# Patient Record
Sex: Female | Born: 1980 | Race: Black or African American | Hispanic: No | Marital: Single | Smoking: Current some day smoker
Health system: Southern US, Community
[De-identification: ages and names within clinical notes are randomized; demographics above are authoritative.]

## PROBLEM LIST (undated history)

## (undated) DIAGNOSIS — D649 Anemia, unspecified: Secondary | ICD-10-CM

## (undated) DIAGNOSIS — E785 Hyperlipidemia, unspecified: Secondary | ICD-10-CM

## (undated) DIAGNOSIS — F329 Major depressive disorder, single episode, unspecified: Secondary | ICD-10-CM

## (undated) DIAGNOSIS — F411 Generalized anxiety disorder: Secondary | ICD-10-CM

## (undated) DIAGNOSIS — F32A Depression, unspecified: Secondary | ICD-10-CM

## (undated) DIAGNOSIS — Z8659 Personal history of other mental and behavioral disorders: Secondary | ICD-10-CM

## (undated) DIAGNOSIS — N840 Polyp of corpus uteri: Secondary | ICD-10-CM

## (undated) DIAGNOSIS — D259 Leiomyoma of uterus, unspecified: Secondary | ICD-10-CM

## (undated) DIAGNOSIS — I10 Essential (primary) hypertension: Secondary | ICD-10-CM

## (undated) HISTORY — DX: Essential (primary) hypertension: I10

## (undated) HISTORY — DX: Hyperlipidemia, unspecified: E78.5

---

## 2003-07-21 ENCOUNTER — Emergency Department (HOSPITAL_COMMUNITY): Admission: EM | Admit: 2003-07-21 | Discharge: 2003-07-21 | Payer: Self-pay | Admitting: Emergency Medicine

## 2003-07-23 ENCOUNTER — Emergency Department (HOSPITAL_COMMUNITY): Admission: EM | Admit: 2003-07-23 | Discharge: 2003-07-23 | Payer: Self-pay | Admitting: Emergency Medicine

## 2003-07-26 ENCOUNTER — Ambulatory Visit (HOSPITAL_COMMUNITY): Admission: RE | Admit: 2003-07-26 | Discharge: 2003-07-26 | Payer: Self-pay | Admitting: Internal Medicine

## 2003-07-26 ENCOUNTER — Encounter: Admission: RE | Admit: 2003-07-26 | Discharge: 2003-07-26 | Payer: Self-pay | Admitting: Internal Medicine

## 2003-08-30 ENCOUNTER — Emergency Department (HOSPITAL_COMMUNITY): Admission: EM | Admit: 2003-08-30 | Discharge: 2003-08-30 | Payer: Self-pay | Admitting: Emergency Medicine

## 2003-09-06 ENCOUNTER — Emergency Department (HOSPITAL_COMMUNITY): Admission: EM | Admit: 2003-09-06 | Discharge: 2003-09-07 | Payer: Self-pay | Admitting: Emergency Medicine

## 2003-11-10 ENCOUNTER — Emergency Department (HOSPITAL_COMMUNITY): Admission: EM | Admit: 2003-11-10 | Discharge: 2003-11-10 | Payer: Self-pay | Admitting: Emergency Medicine

## 2004-01-17 ENCOUNTER — Emergency Department (HOSPITAL_COMMUNITY): Admission: EM | Admit: 2004-01-17 | Discharge: 2004-01-17 | Payer: Self-pay | Admitting: Emergency Medicine

## 2006-03-09 ENCOUNTER — Emergency Department (HOSPITAL_COMMUNITY): Admission: EM | Admit: 2006-03-09 | Discharge: 2006-03-09 | Payer: Self-pay | Admitting: Emergency Medicine

## 2006-04-04 ENCOUNTER — Inpatient Hospital Stay (HOSPITAL_COMMUNITY): Admission: AD | Admit: 2006-04-04 | Discharge: 2006-04-04 | Payer: Self-pay | Admitting: Gynecology

## 2006-05-21 ENCOUNTER — Emergency Department (HOSPITAL_COMMUNITY): Admission: EM | Admit: 2006-05-21 | Discharge: 2006-05-21 | Payer: Self-pay | Admitting: Emergency Medicine

## 2006-10-13 ENCOUNTER — Emergency Department (HOSPITAL_COMMUNITY): Admission: EM | Admit: 2006-10-13 | Discharge: 2006-10-14 | Payer: Self-pay | Admitting: *Deleted

## 2007-07-12 ENCOUNTER — Emergency Department (HOSPITAL_COMMUNITY): Admission: EM | Admit: 2007-07-12 | Discharge: 2007-07-12 | Payer: Self-pay | Admitting: Emergency Medicine

## 2007-07-17 ENCOUNTER — Inpatient Hospital Stay (HOSPITAL_COMMUNITY): Admission: AD | Admit: 2007-07-17 | Discharge: 2007-07-17 | Payer: Self-pay | Admitting: Obstetrics and Gynecology

## 2008-08-07 ENCOUNTER — Inpatient Hospital Stay (HOSPITAL_COMMUNITY): Admission: AD | Admit: 2008-08-07 | Discharge: 2008-08-07 | Payer: Self-pay | Admitting: Obstetrics & Gynecology

## 2009-06-06 ENCOUNTER — Emergency Department (HOSPITAL_COMMUNITY): Admission: EM | Admit: 2009-06-06 | Discharge: 2009-06-06 | Payer: Self-pay | Admitting: Emergency Medicine

## 2009-06-28 ENCOUNTER — Ambulatory Visit: Payer: Self-pay | Admitting: Diagnostic Radiology

## 2009-06-28 ENCOUNTER — Emergency Department (HOSPITAL_BASED_OUTPATIENT_CLINIC_OR_DEPARTMENT_OTHER): Admission: EM | Admit: 2009-06-28 | Discharge: 2009-06-28 | Payer: Self-pay | Admitting: Emergency Medicine

## 2009-10-10 ENCOUNTER — Emergency Department (HOSPITAL_COMMUNITY): Admission: EM | Admit: 2009-10-10 | Discharge: 2009-10-10 | Payer: Self-pay | Admitting: Emergency Medicine

## 2010-02-23 ENCOUNTER — Inpatient Hospital Stay (HOSPITAL_COMMUNITY): Admission: AD | Admit: 2010-02-23 | Discharge: 2010-02-23 | Payer: Self-pay | Admitting: Obstetrics and Gynecology

## 2010-05-28 ENCOUNTER — Inpatient Hospital Stay (HOSPITAL_COMMUNITY): Admission: AD | Admit: 2010-05-28 | Discharge: 2010-05-28 | Payer: Self-pay | Admitting: Obstetrics

## 2010-08-06 ENCOUNTER — Encounter: Payer: Self-pay | Admitting: Obstetrics & Gynecology

## 2010-09-26 LAB — POCT PREGNANCY, URINE: Preg Test, Ur: NEGATIVE

## 2010-09-29 LAB — URINE MICROSCOPIC-ADD ON

## 2010-09-29 LAB — COMPREHENSIVE METABOLIC PANEL
AST: 15 U/L (ref 0–37)
Albumin: 3.1 g/dL — ABNORMAL LOW (ref 3.5–5.2)
Calcium: 8.1 mg/dL — ABNORMAL LOW (ref 8.4–10.5)
Chloride: 109 mEq/L (ref 96–112)
Creatinine, Ser: 0.91 mg/dL (ref 0.4–1.2)
GFR calc Af Amer: >60 mL/min (ref >60)
Total Bilirubin: 0.3 mg/dL (ref 0.3–1.2)
Total Protein: 6.2 g/dL (ref 6.0–8.3)

## 2010-09-29 LAB — URINALYSIS, ROUTINE W REFLEX MICROSCOPIC
Bilirubin Urine: NEGATIVE
Glucose, UA: NEGATIVE mg/dL
Ketones, ur: NEGATIVE mg/dL
Protein, ur: NEGATIVE mg/dL
Urobilinogen, UA: 0.2 mg/dL (ref 0.0–1.0)

## 2010-09-29 LAB — WET PREP, GENITAL
Clue Cells Wet Prep HPF POC: NONE SEEN
Trich, Wet Prep: NONE SEEN
Yeast Wet Prep HPF POC: NONE SEEN

## 2010-09-29 LAB — CBC
MCH: 24.3 pg — ABNORMAL LOW (ref 26.0–34.0)
MCHC: 32.5 g/dL (ref 30.0–36.0)
Platelets: 450 10*3/uL — ABNORMAL HIGH (ref 150–400)
RDW: 17.7 % — ABNORMAL HIGH (ref 11.5–15.5)

## 2010-09-29 LAB — GC/CHLAMYDIA PROBE AMP, GENITAL: GC Probe Amp, Genital: NEGATIVE

## 2010-09-29 LAB — POCT PREGNANCY, URINE: Preg Test, Ur: NEGATIVE

## 2010-10-30 LAB — URINALYSIS, ROUTINE W REFLEX MICROSCOPIC
Bilirubin Urine: NEGATIVE
Glucose, UA: NEGATIVE mg/dL
Protein, ur: 30 mg/dL — AB
Urobilinogen, UA: 0.2 mg/dL (ref 0.0–1.0)

## 2010-10-30 LAB — DIFFERENTIAL
Basophils Relative: 0 % (ref 0–1)
Eosinophils Absolute: 0 10*3/uL (ref 0.0–0.7)
Eosinophils Relative: 0 % (ref 0–5)
Lymphs Abs: 1 10*3/uL (ref 0.7–4.0)
Monocytes Absolute: 0.2 10*3/uL (ref 0.1–1.0)
Monocytes Relative: 2 % — ABNORMAL LOW (ref 3–12)

## 2010-10-30 LAB — URINE MICROSCOPIC-ADD ON

## 2010-10-30 LAB — WET PREP, GENITAL
Clue Cells Wet Prep HPF POC: NONE SEEN
Yeast Wet Prep HPF POC: NONE SEEN

## 2010-10-30 LAB — GC/CHLAMYDIA PROBE AMP, GENITAL
Chlamydia, DNA Probe: NEGATIVE
GC Probe Amp, Genital: NEGATIVE

## 2010-10-30 LAB — CBC
HCT: 31 % — ABNORMAL LOW (ref 36.0–46.0)
Hemoglobin: 10.1 g/dL — ABNORMAL LOW (ref 12.0–15.0)
MCHC: 32.6 g/dL (ref 30.0–36.0)
MCV: 77 fL — ABNORMAL LOW (ref 78.0–100.0)
RBC: 4.03 MIL/uL (ref 3.87–5.11)

## 2011-03-12 ENCOUNTER — Encounter (HOSPITAL_COMMUNITY): Payer: Self-pay | Admitting: *Deleted

## 2011-03-12 ENCOUNTER — Inpatient Hospital Stay (HOSPITAL_COMMUNITY)
Admission: AD | Admit: 2011-03-12 | Discharge: 2011-03-12 | Disposition: A | Discharge status: Home / Self Care | Payer: 59 | Source: Ambulatory Visit | Attending: Obstetrics & Gynecology | Admitting: Obstetrics & Gynecology

## 2011-03-12 DIAGNOSIS — R109 Unspecified abdominal pain: Secondary | ICD-10-CM | POA: Insufficient documentation

## 2011-03-12 NOTE — Progress Notes (Signed)
PT WANTS TO LEAVE AMA STATES SHE CANNOT WAIT TO BE SEEN.

## 2011-03-12 NOTE — Progress Notes (Signed)
ON CYCLE NOW.  WAS DX IN 2006 IN [facility].   SAW DR Tamela Oddi  -  THINKS IN DEC - OR JAN- DISCUSSING AN INJECTION- DID NOT GET IT.   SAYS HURT BAD  ON 03-09-2011-  TOOK ADVIL AND TREADOL -  HARD TO FOCUS AT WORK- NOT MUCH RELIEF  FROM ADVIL.  PAD ON IN TRIAGE- LIGHT RED - SMALL SMEAR.

## 2011-04-05 LAB — WET PREP, GENITAL
Trich, Wet Prep: NONE SEEN
Yeast Wet Prep HPF POC: NONE SEEN

## 2011-04-05 LAB — GC/CHLAMYDIA PROBE AMP, GENITAL
Chlamydia, DNA Probe: NEGATIVE
GC Probe Amp, Genital: NEGATIVE

## 2011-04-05 LAB — CBC
Hemoglobin: 10.5 — ABNORMAL LOW
MCHC: 33.5
RDW: 16.1 — ABNORMAL HIGH

## 2011-06-19 ENCOUNTER — Encounter (HOSPITAL_COMMUNITY): Payer: Self-pay | Admitting: *Deleted

## 2011-06-19 ENCOUNTER — Inpatient Hospital Stay (HOSPITAL_COMMUNITY)
Admission: AD | Admit: 2011-06-19 | Discharge: 2011-06-19 | Disposition: A | Discharge status: Home / Self Care | Payer: 59 | Source: Ambulatory Visit | Attending: Obstetrics & Gynecology | Admitting: Obstetrics & Gynecology

## 2011-06-19 ENCOUNTER — Inpatient Hospital Stay (HOSPITAL_COMMUNITY): Payer: 59

## 2011-06-19 DIAGNOSIS — R109 Unspecified abdominal pain: Secondary | ICD-10-CM | POA: Insufficient documentation

## 2011-06-19 DIAGNOSIS — R51 Headache: Secondary | ICD-10-CM | POA: Insufficient documentation

## 2011-06-19 DIAGNOSIS — R42 Dizziness and giddiness: Secondary | ICD-10-CM | POA: Insufficient documentation

## 2011-06-19 LAB — CBC
HCT: 31.1 % — ABNORMAL LOW (ref 36.0–46.0)
Hemoglobin: 9.7 g/dL — ABNORMAL LOW (ref 12.0–15.0)
MCH: 21.7 pg — ABNORMAL LOW (ref 26.0–34.0)
MCV: 69.7 fL — ABNORMAL LOW (ref 78.0–100.0)
Platelets: 503 10*3/uL — ABNORMAL HIGH (ref 150–400)
RBC: 4.46 MIL/uL (ref 3.87–5.11)
WBC: 7.5 10*3/uL (ref 4.0–10.5)

## 2011-06-19 LAB — URINALYSIS, ROUTINE W REFLEX MICROSCOPIC
Hgb urine dipstick: NEGATIVE
Leukocytes, UA: NEGATIVE
Nitrite: NEGATIVE
Protein, ur: NEGATIVE mg/dL
Specific Gravity, Urine: 1.01 (ref 1.005–1.030)
Urobilinogen, UA: 0.2 mg/dL (ref 0.0–1.0)

## 2011-06-19 LAB — WET PREP, GENITAL: Yeast Wet Prep HPF POC: NONE SEEN

## 2011-06-19 LAB — POCT PREGNANCY, URINE: Preg Test, Ur: NEGATIVE

## 2011-06-19 MED ORDER — KETOROLAC TROMETHAMINE 60 MG/2ML IM SOLN
60.0000 mg | Freq: Once | INTRAMUSCULAR | Status: AC
  Administered 2011-06-19: 60 mg via INTRAMUSCULAR
  Filled 2011-06-19: qty 2

## 2011-06-19 NOTE — Progress Notes (Signed)
Has been on nuva ring since September 29th, due to endometriosis has been on it constantly since then.  Started having breakthrough bleeding 3 weeks ago, was told by gyn to remove nuva ring to have a period.  Removed nuva ring last night, woke up this morning with severe abdominal cramping, headaches, and dizziness.

## 2011-07-05 ENCOUNTER — Emergency Department (HOSPITAL_COMMUNITY)
Admission: EM | Admit: 2011-07-05 | Discharge: 2011-07-05 | Disposition: A | Discharge status: Home / Self Care | Payer: Managed Care, Other (non HMO) | Attending: Emergency Medicine | Admitting: Emergency Medicine

## 2011-07-05 ENCOUNTER — Other Ambulatory Visit: Payer: Self-pay

## 2011-07-05 ENCOUNTER — Encounter (HOSPITAL_COMMUNITY): Payer: Self-pay

## 2011-07-05 DIAGNOSIS — R079 Chest pain, unspecified: Secondary | ICD-10-CM | POA: Insufficient documentation

## 2011-07-05 DIAGNOSIS — F411 Generalized anxiety disorder: Secondary | ICD-10-CM | POA: Insufficient documentation

## 2011-07-05 DIAGNOSIS — I498 Other specified cardiac arrhythmias: Secondary | ICD-10-CM | POA: Insufficient documentation

## 2011-07-05 DIAGNOSIS — F419 Anxiety disorder, unspecified: Secondary | ICD-10-CM

## 2011-07-05 LAB — POCT I-STAT, CHEM 8
BUN: 8 mg/dL (ref 6–23)
Calcium, Ion: 1.16 mmol/L (ref 1.12–1.32)
Chloride: 108 mEq/L (ref 96–112)
Glucose, Bld: 82 mg/dL (ref 70–99)
HCT: 35 % — ABNORMAL LOW (ref 36.0–46.0)
Potassium: 3.8 mEq/L (ref 3.5–5.1)

## 2011-07-05 MED ORDER — ALPRAZOLAM 0.5 MG PO TABS: 0.5000 mg | ORAL_TABLET | Freq: Every evening | ORAL | Status: AC | PRN

## 2011-07-05 NOTE — ED Provider Notes (Signed)
History     CSN: [id number]  Arrival date & time 07/05/11  4098   First MD Initiated Contact with Patient 07/05/11 1040      Chief Complaint  Patient presents with  . Panic Attack  . Chest Pain    (Consider location/radiation/quality/duration/timing/severity/associated sxs/prior treatment) HPI Pt states woke up this am sweating and having chest pain, states took a shower and went back to bed but still feels the same; pt states under a lot or increased stress, hx of depression and "mental health" problems without medical tx.  patient has been under severe stress at work.  Patient denies homicidal or suicidal thoughts or ideations.  Patient has no previous history of coronary or pulmonary problems.  Past Medical History  Diagnosis Date  . Endometriosis     Past Surgical History  Procedure Date  . No past surgeries     Family History  Problem Relation Age of Onset  . Diabetes Maternal Grandfather   . Hypertension Maternal Grandfather     History  Substance Use Topics  . Smoking status: Former Smoker    Types: Cigars  . Smokeless tobacco: Not on file  . Alcohol Use: No     socially    OB History    Grav Para Term Preterm Abortions TAB SAB Ect Mult Living   1 1 1       1       Review of Systems  Allergies  Review of patient's allergies indicates no known allergies. Remaining review of systems unremarkable except as noted in history of present illness Home Medications   Current Outpatient Rx  Name Route Sig Dispense Refill  . ETONOGESTREL-ETHINYL ESTRADIOL 0.12-0.015 MG/24HR VA RING Vaginal Place 1 each vaginally every 28 (twenty-eight) days. Insert vaginally and leave in place for 3 consecutive weeks, then remove for 1 week.     . TRAMADOL HCL 50 MG PO TABS Oral Take 50 mg by mouth every 6 (six) hours as needed. Maximum dose= 8 tablets per day takes for pain     . ALPRAZOLAM 0.5 MG PO TABS Oral Take 1 tablet (0.5 mg total) by mouth at bedtime as needed for  sleep. 30 tablet 0    BP 132/89  Pulse 60  Temp(Src) 98.6 F (37 C) (Oral)  Resp 18  SpO2 100%  LMP 05/29/2011  Physical Exam  Nursing note and vitals reviewed. Constitutional: She is oriented to person, place, and time. She appears well-developed and well-nourished. No distress.  HENT:  Head: Normocephalic and atraumatic.  Eyes: Pupils are equal, round, and reactive to light.  Neck: Normal range of motion.  Cardiovascular: Normal rate and intact distal pulses.  Exam reveals no friction rub.   No murmur heard.        Date: 07/05/2011  Rate: 49  Rhythm: sinus bradycardia  QRS Axis: normal  Intervals: normal  ST/T Wave abnormalities: normal  Conduction Disutrbances:none:   Old EKG Reviewed: unchanged     Pulmonary/Chest: No respiratory distress.  Abdominal: Normal appearance. She exhibits no distension.  Musculoskeletal: Normal range of motion.  Neurological: She is alert and oriented to person, place, and time. No cranial nerve deficit.  Skin: Skin is warm and dry. No rash noted.  Psychiatric: She has a normal mood and affect. Her behavior is normal.    ED Course  Procedures (including critical care time)  Labs Reviewed  POCT I-STAT, CHEM 8 - Abnormal; Notable for the following:    Hemoglobin 11.9 (*)  HCT 35.0 (*)    All other components within normal limits  POCT I-STAT TROPONIN I  I-STAT TROPONIN I  I-STAT, CHEM 8   No results found.   1. Anxiety       MDM         Nelia Shi, MD 07/05/11 [phone]

## 2011-07-05 NOTE — ED Notes (Signed)
Pt states woke up this am sweating and having chest pain, states took a shower and went back to bed but still feels the same; pt states under a lot or increased stress, hx of depression and "mental health" problems without medical tx

## 2011-12-15 ENCOUNTER — Inpatient Hospital Stay (HOSPITAL_COMMUNITY): Payer: 59

## 2011-12-15 ENCOUNTER — Inpatient Hospital Stay (HOSPITAL_COMMUNITY)
Admission: AD | Admit: 2011-12-15 | Discharge: 2011-12-15 | Disposition: A | Discharge status: Home / Self Care | Payer: 59 | Source: Ambulatory Visit | Attending: Obstetrics | Admitting: Obstetrics

## 2011-12-15 DIAGNOSIS — D259 Leiomyoma of uterus, unspecified: Secondary | ICD-10-CM | POA: Insufficient documentation

## 2011-12-15 DIAGNOSIS — N949 Unspecified condition associated with female genital organs and menstrual cycle: Secondary | ICD-10-CM | POA: Insufficient documentation

## 2011-12-15 DIAGNOSIS — N939 Abnormal uterine and vaginal bleeding, unspecified: Secondary | ICD-10-CM

## 2011-12-15 DIAGNOSIS — N898 Other specified noninflammatory disorders of vagina: Secondary | ICD-10-CM

## 2011-12-15 DIAGNOSIS — R109 Unspecified abdominal pain: Secondary | ICD-10-CM | POA: Insufficient documentation

## 2011-12-15 DIAGNOSIS — N938 Other specified abnormal uterine and vaginal bleeding: Secondary | ICD-10-CM | POA: Insufficient documentation

## 2011-12-15 LAB — DIFFERENTIAL
Basophils Absolute: 0 10*3/uL (ref 0.0–0.1)
Basophils Relative: 0 % (ref 0–1)
Eosinophils Absolute: 0 10*3/uL (ref 0.0–0.7)
Eosinophils Relative: 1 % (ref 0–5)
Neutrophils Relative %: 67 % (ref 43–77)

## 2011-12-15 LAB — URINALYSIS, ROUTINE W REFLEX MICROSCOPIC
Glucose, UA: NEGATIVE mg/dL
Leukocytes, UA: NEGATIVE
Protein, ur: NEGATIVE mg/dL
Specific Gravity, Urine: 1.025 (ref 1.005–1.030)
Urobilinogen, UA: 0.2 mg/dL (ref 0.0–1.0)

## 2011-12-15 LAB — CBC
MCH: 25.3 pg — ABNORMAL LOW (ref 26.0–34.0)
MCV: 77 fL — ABNORMAL LOW (ref 78.0–100.0)
Platelets: 351 10*3/uL (ref 150–400)
RBC: 4.7 MIL/uL (ref 3.87–5.11)
RDW: 17 % — ABNORMAL HIGH (ref 11.5–15.5)

## 2011-12-15 LAB — POCT PREGNANCY, URINE: Preg Test, Ur: NEGATIVE

## 2011-12-15 LAB — URINE MICROSCOPIC-ADD ON

## 2011-12-15 MED ORDER — KETOROLAC TROMETHAMINE 60 MG/2ML IM SOLN
60.0000 mg | Freq: Once | INTRAMUSCULAR | Status: AC
  Administered 2011-12-15: 60 mg via INTRAMUSCULAR
  Filled 2011-12-15: qty 2

## 2011-12-15 MED ORDER — IBUPROFEN 800 MG PO TABS: 800.0000 mg | ORAL_TABLET | Freq: Three times a day (TID) | ORAL | Status: AC | PRN

## 2011-12-15 NOTE — MAU Note (Signed)
Patient is on Nuva Ring for endometriosis, woke up this morning with bleeding and clots, last changed on 11/29/11, cramping

## 2011-12-15 NOTE — Progress Notes (Signed)
[facility]  EXam BY E. Key NP vaginal bleeding noted

## 2011-12-15 NOTE — MAU Note (Signed)
Bleeding very minimal here. Pt states bleeding nor pain worsened with TV US. States pain "slightly improved".

## 2011-12-15 NOTE — MAU Provider Note (Signed)
History     CSN: [id number]  Arrival date and time: 12/15/11 1208   First Provider Initiated Contact with Patient 12/15/11 1359      Chief Complaint  Patient presents with  . Vaginal Bleeding  . Abdominal Pain   HPI Danielle Dawson is 31 y.o. G1P1001 presents with heavy vaginal bleeding. Passed a blood clot at 2pm that was "stuck" she pulled it and it was "thick".  Passes another clot.   LMP 10/25/11.  Patient of Dr. Marcia Brash.  Hx of endometeriosis treated with continuous [place] of Nuvaring. She reports she was instructed to keep each ring in for 35days but had period around the 35th day so she on her own is now changing her ring every 30 days.  This is "better".   She uses 3 rings in a row and then out until menses begin and restarts at the end of a cycle.  Pain today is different as the pain she gets with endometriosis, less now but pulsating on the left.  Not sexually active since January.  She is not concerned about STi/s stating she has been tested for these.  Denies nausea, vomiting , fever or chills.    Past Medical History  Diagnosis Date  . Endometriosis     Past Surgical History  Procedure Date  . No past surgeries     Family History  Problem Relation Age of Onset  . Diabetes Maternal Grandfather   . Hypertension Maternal Grandfather     History  Substance Use Topics  . Smoking status: Former Smoker    Types: Cigars  . Smokeless tobacco: Not on file  . Alcohol Use: No     socially    Allergies: No Known Allergies  Prescriptions prior to admission  Medication Sig Dispense Refill  . etonogestrel-ethinyl estradiol (NUVARING) 0.12-0.015 MG/24HR vaginal ring Place 1 each vaginally every 28 (twenty-eight) days. Insert vaginally and leave in place for 3 consecutive weeks, then remove for 1 week.         Review of Systems  Constitutional: Negative for fever and chills.  Gastrointestinal: Positive for abdominal pain (left sided-lower). Negative for nausea and  vomiting.  Genitourinary:       + for vaginal bleeding with passing of 2 large clots   Physical Exam   Blood pressure 128/92, pulse 70, temperature 98.8 F (37.1 C), height 5\' 5"  (1.651 m), weight 81.466 kg (179 lb 9.6 oz), last menstrual period 10/25/2011.  Physical Exam  Constitutional: She is oriented to person, place, and time. She appears well-developed and well-nourished. No distress.  HENT:  Head: Normocephalic.  Neck: Normal range of motion.  Cardiovascular: Normal rate.   Respiratory: Effort normal.  GI: Soft. There is tenderness (on the left). There is no rebound and no guarding.  Genitourinary: Uterus is not enlarged and not tender. Cervix exhibits no motion tenderness, no discharge and no friability. Right adnexum displays no mass, no tenderness and no fullness. Left adnexum displays tenderness. Left adnexum displays no mass and no fullness. There is bleeding (small amount of bleeding with a few pin size clots) around the vagina.  Neurological: She is alert and oriented to person, place, and time.  Skin: Skin is warm and dry.  Psychiatric: She has a normal mood and affect. Her behavior is normal. Thought content normal.   Clinical Data: Vaginal bleeding, left-sided pain.  TRANSVAGINAL ULTRASOUND OF PELVIS  Technique: Transvaginal ultrasound examination of the pelvis was  performed including evaluation of the uterus, ovaries, adnexal  regions, and pelvic cul-de-sac.  Comparison: 06/19/2011  Findings:  Uterus: 8.8 x 5.4 x 6.8 cm. The uterus is retroverted. 1.9 cm  and 1.5 cm fibroids anteriorly.  Endometrium: 3 mm in thickness, slightly thicker in the  endocervical canal at 5 mm, possible small amount of blood within  the endocervical canal.  Right ovary: 3.5 x 1.1 x 1.9 cm. Normal size and echotexture. No  adnexal masses.  Left ovary: 3.3 x 1.6 x 1.9 cm. Normal size and echotexture. No  adnexal masses.  Other Findings: Trace free fluid in the pelvis.  IMPRESSION:    Small anterior fibroid as above. Otherwise unremarkable study.  Original Report Authenticated By: Cyndie Chime, M.D.   Results for orders placed during the hospital encounter of 12/15/11 (from the past 24 hour(s))  URINALYSIS, ROUTINE W REFLEX MICROSCOPIC     Status: Abnormal   Collection Time   12/15/11 12:15 PM      Component Value Range   Color, Urine YELLOW  YELLOW    APPearance CLEAR  CLEAR    Specific Gravity, Urine 1.025  1.005 - 1.030    pH 5.5  5.0 - 8.0    Glucose, UA NEGATIVE  NEGATIVE (mg/dL)   Hgb urine dipstick LARGE (*) NEGATIVE    Bilirubin Urine NEGATIVE  NEGATIVE    Ketones, ur NEGATIVE  NEGATIVE (mg/dL)   Protein, ur NEGATIVE  NEGATIVE (mg/dL)   Urobilinogen, UA 0.2  0.0 - 1.0 (mg/dL)   Nitrite NEGATIVE  NEGATIVE    Leukocytes, UA NEGATIVE  NEGATIVE   URINE MICROSCOPIC-ADD ON     Status: Normal   Collection Time   12/15/11 12:15 PM      Component Value Range   Squamous Epithelial / LPF RARE  RARE    RBC / HPF TOO NUMEROUS TO COUNT  <3 (RBC/hpf)  POCT PREGNANCY, URINE     Status: Normal   Collection Time   12/15/11 12:29 PM      Component Value Range   Preg Test, Ur NEGATIVE  NEGATIVE   CBC     Status: Abnormal   Collection Time   12/15/11  2:21 PM      Component Value Range   WBC 8.6  4.0 - 10.5 (K/uL)   RBC 4.70  3.87 - 5.11 (MIL/uL)   Hemoglobin 11.9 (*) 12.0 - 15.0 (g/dL)   HCT 16.1  09.6 - 04.5 (%)   MCV 77.0 (*) 78.0 - 100.0 (fL)   MCH 25.3 (*) 26.0 - 34.0 (pg)   MCHC 32.9  30.0 - 36.0 (g/dL)   RDW 40.9 (*) 81.1 - 15.5 (%)   Platelets 351  150 - 400 (K/uL)  DIFFERENTIAL     Status: Normal   Collection Time   12/15/11  2:21 PM      Component Value Range   Neutrophils Relative 67  43 - 77 (%)   Neutro Abs 5.7  1.7 - 7.7 (K/uL)   Lymphocytes Relative 26  12 - 46 (%)   Lymphs Abs 2.2  0.7 - 4.0 (K/uL)   Monocytes Relative 6  3 - 12 (%)   Monocytes Absolute 0.5  0.1 - 1.0 (K/uL)   Eosinophils Relative 1  0 - 5 (%)   Eosinophils Absolute 0.0  0.0 -  0.7 (K/uL)   Basophils Relative 0  0 - 1 (%)   Basophils Absolute 0.0  0.0 - 0.1 (K/uL)   MAU Course  Procedures  GC/CHL culture sent to lab  MDM  Patient brought in a large clot that appeared to mostly be tissue.  Sent to pathology for evaluation Back from ultrasound.  Pain continues.  Will give Toradol 60mg  IM now  Assessment and Plan  A: Abnormal Vaginal bleeding     Uterine fibroid on ultrasound     Abdominal pain with hx of endometriosis  P:  Toradol given here      Rx for ibuprofen 600mg  po q8 hrs prn #20     Follow up with Dr. Tamela Oddi     Call their office for pathology results in 1-2 weeks  Natiya Seelinger,EVE M 12/15/2011, 1:59 PM

## 2011-12-15 NOTE — MAU Note (Signed)
Pt states she started having heavy bleeding and clotting this morning about 10am, that has continued

## 2011-12-15 NOTE — Discharge Instructions (Signed)
Fibroids Fibroids are lumps (tumors) that can occur any place in a woman's body. These lumps are not cancerous. Fibroids vary in size, weight, and where they grow. HOME CARE  Do not take aspirin.   Write down the number of pads or tampons you use during your period. Tell your doctor. This can help determine the best treatment for you.  GET HELP RIGHT AWAY IF:  You have pain in your lower belly (abdomen) that is not helped with medicine.   You have cramps that are not helped with medicine.   You have more bleeding between or during your period.   You feel lightheaded or pass out (faint).   Your lower belly pain gets worse.  MAKE SURE YOU:  Understand these instructions.   Will watch your condition.   Will get help right away if you are not doing well or get worse.  Document Released: 08/04/2010 Document Revised: 06/21/2011 Document Reviewed: 08/04/2010 [employer] Patient Information 2012 [employer].Abnormal Vaginal Bleeding Abnormal vaginal bleeding means bleeding from the vagina that is not your normal menstrual period. Bleeding may be heavy or light. It may last for days or come and go. There are many problems that may cause this. HOME CARE  Keep track of your periods on a calendar if they are not regular.   Write down:   How often pads or tampons are changed.   The size and number of clots, if there are any.   A change in the color of the blood.   A change in the amount of blood.   Any smell.   The time and strength of cramps or pain.   Limit activity as told.   Eat a healthy diet.   Do not have sex (intercourse) until your doctor says it is okay.   Never have unprotected sex unless you are trying to get pregnant.   Only take medicine as told by your doctor.  GET HELP RIGHT AWAY IF:   You get dizzy or feel faint when standing up.   You have to change pads or tampons more than once an hour.   You feel a sudden change in your pain.   You start  bleeding heavily.   You develop a fever.  MAKE SURE YOU:  Understand these instructions.   Will watch your condition.   Will get help right away if you are not doing well or get worse.  Document Released: 04/29/2009 Document Revised: 06/21/2011 Document Reviewed: 04/29/2009 [employer] Patient Information 2012 [employer]. 

## 2012-08-31 ENCOUNTER — Inpatient Hospital Stay (HOSPITAL_COMMUNITY)
Admission: AD | Admit: 2012-08-31 | Discharge: 2012-09-01 | Disposition: A | Discharge status: Hospice | Payer: Medicaid Other | Source: Ambulatory Visit | Attending: Obstetrics & Gynecology | Admitting: Obstetrics & Gynecology

## 2012-08-31 DIAGNOSIS — N809 Endometriosis, unspecified: Secondary | ICD-10-CM | POA: Insufficient documentation

## 2012-08-31 DIAGNOSIS — N949 Unspecified condition associated with female genital organs and menstrual cycle: Secondary | ICD-10-CM | POA: Insufficient documentation

## 2012-08-31 DIAGNOSIS — N946 Dysmenorrhea, unspecified: Secondary | ICD-10-CM | POA: Insufficient documentation

## 2012-08-31 NOTE — MAU Note (Signed)
Pt stes she has a history of endometrosis-states she is on her period and it is more painful than it usually is-took m ortrin 800mg  t 1700-tramadol 1 tab at 2000- no relief

## 2012-09-01 ENCOUNTER — Inpatient Hospital Stay (HOSPITAL_COMMUNITY): Payer: Medicaid Other

## 2012-09-01 ENCOUNTER — Encounter (HOSPITAL_COMMUNITY): Payer: Self-pay | Admitting: *Deleted

## 2012-09-01 DIAGNOSIS — N809 Endometriosis, unspecified: Secondary | ICD-10-CM

## 2012-09-01 LAB — CBC WITH DIFFERENTIAL/PLATELET
Eosinophils Absolute: 0.1 10*3/uL (ref 0.0–0.7)
Eosinophils Relative: 1 % (ref 0–5)
HCT: 32.7 % — ABNORMAL LOW (ref 36.0–46.0)
Hemoglobin: 10.4 g/dL — ABNORMAL LOW (ref 12.0–15.0)
Lymphs Abs: 2.4 10*3/uL (ref 0.7–4.0)
MCH: 24.8 pg — ABNORMAL LOW (ref 26.0–34.0)
MCHC: 31.8 g/dL (ref 30.0–36.0)
MCV: 77.9 fL — ABNORMAL LOW (ref 78.0–100.0)
Monocytes Absolute: 0.8 10*3/uL (ref 0.1–1.0)
Monocytes Relative: 10 % (ref 3–12)
Neutrophils Relative %: 61 % (ref 43–77)
RBC: 4.2 MIL/uL (ref 3.87–5.11)

## 2012-09-01 LAB — URINE MICROSCOPIC-ADD ON

## 2012-09-01 LAB — URINALYSIS, ROUTINE W REFLEX MICROSCOPIC
Bilirubin Urine: NEGATIVE
Glucose, UA: NEGATIVE mg/dL
Protein, ur: NEGATIVE mg/dL

## 2012-09-01 LAB — GC/CHLAMYDIA PROBE AMP
CT Probe RNA: NEGATIVE
GC Probe RNA: NEGATIVE

## 2012-09-01 MED ORDER — KETOROLAC TROMETHAMINE 60 MG/2ML IM SOLN
60.0000 mg | Freq: Once | INTRAMUSCULAR | Status: AC
  Administered 2012-09-01: 60 mg via INTRAMUSCULAR
  Filled 2012-09-01: qty 2

## 2012-09-01 MED ORDER — TRAMADOL HCL 50 MG PO TABS: 50.0000 mg | ORAL_TABLET | Freq: Four times a day (QID) | ORAL | Status: DC | PRN

## 2012-09-01 NOTE — MAU Note (Signed)
Pt started cycle on Saturday the 15th and has mod -heavy flow changing her pad 8 times in 12 hrs.  Says pads are soaked when changing.

## 2012-09-01 NOTE — MAU Provider Note (Signed)
History     CSN: [id number]  Arrival date and time: 08/31/12 2311   First Provider Initiated Contact with Patient 09/01/12 0034      Chief Complaint  Patient presents with  . Abdominal Pain   HPI This is a 32 y.o. female who presents with c/o pelvic pain, unrelieved by Motrin and Tramadol. States has been diagnosed with endometriosis, but stopped Nuvaring treatment in July. Never went back to [facility] after that, so did not get Rx refilled. States is still a patient there. States takes ibuprofen 800mg  regularly with Tramadol. Denies fever. Pain started with period.  RN Note: Pt started cycle on Saturday the 15th and has mod -heavy flow changing her pad 8 times in 12 hrs. Says pads are soaked when changing.      OB History   Grav Para Term Preterm Abortions TAB SAB Ect Mult Living   1 1 1       1       Past Medical History  Diagnosis Date  . Endometriosis   . Fibroid   . Anxiety     Past Surgical History  Procedure Laterality Date  . No past surgeries      Family History  Problem Relation Age of Onset  . Diabetes Maternal Grandfather   . Hypertension Maternal Grandfather     History  Substance Use Topics  . Smoking status: Former Smoker    Types: Cigars    Quit date: 06/01/2012  . Smokeless tobacco: Not on file  . Alcohol Use: No     Comment: socially    Allergies: No Known Allergies  Prescriptions prior to admission  Medication Sig Dispense Refill  . etonogestrel-ethinyl estradiol (NUVARING) 0.12-0.015 MG/24HR vaginal ring Place 1 each vaginally every 28 (twenty-eight) days. Insert vaginally and leave in place for 3 consecutive weeks, then remove for 1 week.         Review of Systems  Constitutional: Negative for fever, chills and malaise/fatigue.  Gastrointestinal: Positive for abdominal pain. Negative for nausea, vomiting, diarrhea and constipation.  Genitourinary: Negative for dysuria.  Neurological: Negative for weakness and headaches.   Physical  Exam   Blood pressure 129/73, pulse 68, temperature 97.9 F (36.6 C), temperature source Oral, resp. rate 16, height 5\' 4"  (1.626 m), weight 202 lb (91.627 kg), last menstrual period 08/31/2012, SpO2 100.00%.  Physical Exam  Constitutional: She is oriented to person, place, and time. She appears well-developed and well-nourished. No distress.  Cardiovascular: Normal rate.   Respiratory: Effort normal.  GI: Soft. She exhibits no distension and no mass. There is tenderness (over uterus and LLQ). There is no rebound and no guarding.  Genitourinary: Uterus normal. Vaginal discharge (moderate vaginal blood) found.  + cervical motion tenderness   Musculoskeletal: Normal range of motion.  Neurological: She is alert and oriented to person, place, and time.  Skin: Skin is warm and dry.  Psychiatric: She has a normal mood and affect.    MAU Course  Procedures  MDM Results for orders placed during the hospital encounter of 08/31/12 (from the past 24 hour(s))  WET PREP, GENITAL     Status: Abnormal   Collection Time    09/01/12 12:42 AM      Result Value Range   Yeast Wet Prep HPF POC NONE SEEN  NONE SEEN   Trich, Wet Prep NONE SEEN  NONE SEEN   Clue Cells Wet Prep HPF POC FEW (*) NONE SEEN   WBC, Wet Prep HPF POC FEW (*) NONE  SEEN  CBC WITH DIFFERENTIAL     Status: Abnormal   Collection Time    09/01/12 12:54 AM      Result Value Range   WBC 8.6  4.0 - 10.5 K/uL   RBC 4.20  3.87 - 5.11 MIL/uL   Hemoglobin 10.4 (*) 12.0 - 15.0 g/dL   HCT 95.2 (*) 84.1 - 32.4 %   MCV 77.9 (*) 78.0 - 100.0 fL   MCH 24.8 (*) 26.0 - 34.0 pg   MCHC 31.8  30.0 - 36.0 g/dL   RDW 40.1  02.7 - 25.3 %   Platelets 412 (*) 150 - 400 K/uL   Neutrophils Relative 61  43 - 77 %   Neutro Abs 5.3  1.7 - 7.7 K/uL   Lymphocytes Relative 28  12 - 46 %   Lymphs Abs 2.4  0.7 - 4.0 K/uL   Monocytes Relative 10  3 - 12 %   Monocytes Absolute 0.8  0.1 - 1.0 K/uL   Eosinophils Relative 1  0 - 5 %   Eosinophils Absolute  0.1  0.0 - 0.7 K/uL   Basophils Relative 1  0 - 1 %   Basophils Absolute 0.1  0.0 - 0.1 K/uL  URINALYSIS, ROUTINE W REFLEX MICROSCOPIC     Status: Abnormal   Collection Time    09/01/12  1:13 AM      Result Value Range   Color, Urine YELLOW  YELLOW   APPearance CLEAR  CLEAR   Specific Gravity, Urine >1.030 (*) 1.005 - 1.030   pH 6.0  5.0 - 8.0   Glucose, UA NEGATIVE  NEGATIVE mg/dL   Hgb urine dipstick LARGE (*) NEGATIVE   Bilirubin Urine NEGATIVE  NEGATIVE   Ketones, ur NEGATIVE  NEGATIVE mg/dL   Protein, ur NEGATIVE  NEGATIVE mg/dL   Urobilinogen, UA 0.2  0.0 - 1.0 mg/dL   Nitrite NEGATIVE  NEGATIVE   Leukocytes, UA NEGATIVE  NEGATIVE  URINE MICROSCOPIC-ADD ON     Status: None   Collection Time    09/01/12  1:13 AM      Result Value Range   Squamous Epithelial / LPF RARE  RARE   WBC, UA 0-2  <3 WBC/hpf   RBC / HPF 0-2  <3 RBC/hpf  US Pelvis Complete  09/01/2012  *RADIOLOGY REPORT*  Clinical Data: Pelvic pain  TRANSABDOMINAL AND TRANSVAGINAL ULTRASOUND OF PELVIS Technique:  Both transabdominal and transvaginal ultrasound examinations of the pelvis were performed. Transabdominal technique was performed for global imaging of the pelvis including uterus, ovaries, adnexal regions, and pelvic cul-de-sac.  It was necessary to proceed with endovaginal exam following the transabdominal exam to visualize the endometrium and adnexa.  Comparison:  12/15/2011  Findings:  Uterus: Retroverted with a small fibroid noted anteriorly, intramural.  Uterus measures 9.8 x 5.0 x 5.4 cm.  Fibroid measures 1.6 cm.  Endometrium: Fluid/blood products mobile within the endometrium. Measures 14 mm in thickness.  Right ovary:  Normal appearance, measuring 3.8 x 1.8 x 1.9 cm.  Left ovary: Normal appearance, measuring 3.4 x 1.5 x 2.5 cm.  Other findings: Small amount of free fluid.  IMPRESSION: 1.6 cm intramural fibroid.  Normal sonographic appearance to the ovaries.  Small amount of blood/debris within the endometrial  canal.  Small amount of free fluid.   Original Report Authenticated By: Jearld Lesch, M.D.     Assessment and Plan  A:  Pelvic pain      Endometriosis      Dysmenorrhea  No evidence for appy or ovarian cyst  P:  Discussed findings with patient      Rx Tramadol #20      Advised to call Dr Tamela Oddi this morning to make plan   [facility] 09/01/2012, 12:50 AM

## 2012-09-23 ENCOUNTER — Encounter (HOSPITAL_COMMUNITY): Payer: Self-pay | Admitting: Emergency Medicine

## 2012-09-23 ENCOUNTER — Emergency Department (HOSPITAL_COMMUNITY): Payer: Medicaid Other

## 2012-09-23 ENCOUNTER — Emergency Department (HOSPITAL_COMMUNITY)
Admission: EM | Admit: 2012-09-23 | Discharge: 2012-09-23 | Disposition: A | Discharge status: Home / Self Care | Payer: Medicaid Other | Attending: Emergency Medicine | Admitting: Emergency Medicine

## 2012-09-23 DIAGNOSIS — Z8659 Personal history of other mental and behavioral disorders: Secondary | ICD-10-CM | POA: Insufficient documentation

## 2012-09-23 DIAGNOSIS — Z87891 Personal history of nicotine dependence: Secondary | ICD-10-CM | POA: Insufficient documentation

## 2012-09-23 DIAGNOSIS — Z8742 Personal history of other diseases of the female genital tract: Secondary | ICD-10-CM | POA: Insufficient documentation

## 2012-09-23 DIAGNOSIS — R209 Unspecified disturbances of skin sensation: Secondary | ICD-10-CM | POA: Insufficient documentation

## 2012-09-23 DIAGNOSIS — M79601 Pain in right arm: Secondary | ICD-10-CM

## 2012-09-23 DIAGNOSIS — M25579 Pain in unspecified ankle and joints of unspecified foot: Secondary | ICD-10-CM | POA: Insufficient documentation

## 2012-09-23 DIAGNOSIS — M7989 Other specified soft tissue disorders: Secondary | ICD-10-CM | POA: Insufficient documentation

## 2012-09-23 LAB — POCT I-STAT, CHEM 8
HCT: 34 % — ABNORMAL LOW (ref 36.0–46.0)
Hemoglobin: 11.6 g/dL — ABNORMAL LOW (ref 12.0–15.0)
Potassium: 3.8 mEq/L (ref 3.5–5.1)
Sodium: 140 mEq/L (ref 135–145)

## 2012-09-23 MED ORDER — NAPROXEN 500 MG PO TABS: 500.0000 mg | ORAL_TABLET | Freq: Two times a day (BID) | ORAL | Status: DC

## 2012-09-23 NOTE — Progress Notes (Signed)
Confirmed pcp as Jeanni Saari EPIC updated

## 2012-09-23 NOTE — ED Notes (Signed)
States that she woke up this am and her feet and hands were swollen. States that she has some tingling in her toes and feet as well. States that she has never had this pain before.

## 2012-09-23 NOTE — ED Provider Notes (Signed)
History  This chart was scribed for non-physician practitioner working with Hilario Quarry, MD, by Candelaria Stagers, ED Scribe. This patient was seen in room WTR6/WTR6 and the patient's care was started at 3:49 PM   CSN: [id number]  Arrival date & time 09/23/12  1443   First MD Initiated Contact with Patient 09/23/12 1532      Chief Complaint  Patient presents with  . Hand Pain  . Foot Pain     The history is provided by the patient. No language interpreter was used.   Danielle Dawson is a 32 y.o. female who presents to the Emergency Department complaining of bilateral hand and bilateral foot numbness and tingling that started earlier today.  She is also experiencing swelling and tightness stating that she was not able to put her shoes on this morning.  Pt states that the numbness started with her right foot and then she began experiencing tingling to the hands, then the feet.  Pt reports that earlier today her hands and feet were red.  She denies coldness to the hands or feet.  Pt is urinating normally.  She denies SOB or chest pain.  She denies h/o diabetes.  Pt reports she has never experienced these sx before.        Past Medical History  Diagnosis Date  . Endometriosis   . Fibroid   . Anxiety     Past Surgical History  Procedure Laterality Date  . No past surgeries      Family History  Problem Relation Age of Onset  . Diabetes Maternal Grandfather   . Hypertension Maternal Grandfather     History  Substance Use Topics  . Smoking status: Former Smoker    Types: Cigars    Quit date: 06/01/2012  . Smokeless tobacco: Not on file  . Alcohol Use: No     Comment: socially    OB History   Grav Para Term Preterm Abortions TAB SAB Ect Mult Living   1 1 1       1       Review of Systems  Respiratory: Negative for shortness of breath.   Cardiovascular: Negative for chest pain.  Genitourinary: Negative for difficulty urinating.  Skin: Positive for color change (redness  to hands).  Neurological: Positive for numbness (numbness and tingling to bilateral hands and feet).  All other systems reviewed and are negative.    Allergies  Review of patient's allergies indicates no known allergies.  Home Medications   Current Outpatient Rx  Name  Route  Sig  Dispense  Refill  . Multiple Vitamin (ONE-A-DAY ESSENTIAL PO)   Oral   Take 1 tablet by mouth daily.         . traMADol (ULTRAM) 50 MG tablet   Oral   Take 1 tablet (50 mg total) by mouth every 6 (six) hours as needed for pain.   20 tablet   0   . naproxen (NAPROSYN) 500 MG tablet   Oral   Take 1 tablet (500 mg total) by mouth 2 (two) times daily.   30 tablet   0     BP 121/79  Pulse 63  Temp(Src) 98.4 F (36.9 C) (Oral)  Resp 18  SpO2 100%  LMP 08/31/2012  Physical Exam  Nursing note and vitals reviewed. Constitutional: She is oriented to person, place, and time. She appears well-developed and well-nourished. No distress.  HENT:  Head: Normocephalic and atraumatic.  Eyes: EOM are normal.  Neck: Neck supple.  No tracheal deviation present.  Cardiovascular: Normal rate and intact distal pulses.   Pulmonary/Chest: Effort normal. No respiratory distress.  Musculoskeletal: Normal range of motion.  Good bilateral radial pulses.  Strong bilateral pedal pulses.  Lower extremity edema.  Normal sensation.    Neurological: She is alert and oriented to person, place, and time.  Skin: Skin is warm.  Skin moist.   Psychiatric: She has a normal mood and affect. Her behavior is normal.    ED Course  Procedures  DIAGNOSTIC STUDIES: Oxygen Saturation is 100% on room air, normal by my interpretation.    COORDINATION OF CARE:  3:54 PM Discussed course of care with pt which includes chest xray and blood work.  Pt understands and agrees.   4:48 PM Labs and chest xray unremarkable.  H/o sx are not c/o with decreased circulation to extremities.  Will treat pain with short course of medication. Pt  has PCP, Velna Hatchet and medicaid.  Will have her schedule f/u appointment with PCP.    Labs Reviewed  POCT I-STAT, CHEM 8 - Abnormal; Notable for the following:    Glucose, Bld 117 (*)    Hemoglobin 11.6 (*)    HCT 34.0 (*)    All other components within normal limits   Dg Chest 2 View  09/23/2012  *RADIOLOGY REPORT*  Clinical Data: Lower extremity swelling  CHEST - 2 VIEW  Comparison:  None.  Findings:  The heart size and mediastinal contours are within normal limits.  Both lungs are clear.  The visualized skeletal structures are unremarkable.  IMPRESSION: No active cardiopulmonary disease.   Original Report Authenticated By: Judie Petit. Shick, M.D.      1. Paresthesia and pain of both upper extremities       MDM  Pt has been advised of the symptoms that warrant their return to the ED. Patient has voiced understanding and has agreed to follow-up with the PCP or specialist.   I personally performed the services described in this documentation, which was scribed in my presence. The recorded information has been reviewed and is accurate.        Dorthula Matas, PA-C 09/23/12 2349

## 2012-09-24 NOTE — ED Provider Notes (Signed)
History/physical exam/procedure(s) were performed by non-physician practitioner and as supervising physician I was immediately available for consultation/collaboration. I have reviewed all notes and am in agreement with care and plan. I personally performed the services described in this documentation, which was scribed in my presence. The recorded information has been reviewed and considered.   Hilario Quarry, MD 09/24/12 [phone]

## 2012-10-02 ENCOUNTER — Telehealth: Payer: Self-pay | Admitting: *Deleted

## 2012-10-02 NOTE — Telephone Encounter (Signed)
Pt states she suffers with endometriosis. Pt states she has been up since last night. Pt states she has been taking Ibuprofen 800 mg and Tramadol with no relief. Instructed pt to go to MAU with pain being that bad and scheduled a follow up appointment for November 05, 2012 for a follow up visit.

## 2012-11-05 ENCOUNTER — Encounter: Payer: Self-pay | Admitting: Obstetrics & Gynecology

## 2012-11-05 ENCOUNTER — Ambulatory Visit (INDEPENDENT_AMBULATORY_CARE_PROVIDER_SITE_OTHER): Payer: Medicaid Other | Admitting: Obstetrics & Gynecology

## 2012-11-05 VITALS — BP 128/82 | HR 61 | Temp 98.5°F | Ht 64.0 in | Wt 205.0 lb

## 2012-11-05 DIAGNOSIS — Z01419 Encounter for gynecological examination (general) (routine) without abnormal findings: Secondary | ICD-10-CM

## 2012-11-05 DIAGNOSIS — R350 Frequency of micturition: Secondary | ICD-10-CM

## 2012-11-05 DIAGNOSIS — N760 Acute vaginitis: Secondary | ICD-10-CM

## 2012-11-05 DIAGNOSIS — N803 Endometriosis of pelvic peritoneum: Secondary | ICD-10-CM

## 2012-11-05 DIAGNOSIS — Z0000 Encounter for general adult medical examination without abnormal findings: Secondary | ICD-10-CM

## 2012-11-05 DIAGNOSIS — IMO0001 Reserved for inherently not codable concepts without codable children: Secondary | ICD-10-CM

## 2012-11-05 DIAGNOSIS — Z0289 Encounter for other administrative examinations: Secondary | ICD-10-CM

## 2012-11-05 LAB — POCT URINALYSIS DIPSTICK
Glucose, UA: NEGATIVE
Nitrite, UA: NEGATIVE
Urobilinogen, UA: NEGATIVE

## 2012-11-05 LAB — RPR

## 2012-11-05 MED ORDER — METRONIDAZOLE 0.75 % VA GEL: 1.0000 | VAGINAL | Status: DC

## 2012-11-05 MED ORDER — ETONOGESTREL-ETHINYL ESTRADIOL 0.12-0.015 MG/24HR VA RING: VAGINAL_RING | VAGINAL | Status: DC

## 2012-11-05 NOTE — Progress Notes (Signed)
Subjective:     Danielle Dawson is a 32 y.o. female here for a routine exam.  Current complaints: Patient is in office to discuss her endometriosis, fertility and restart of her nuva ring.     Gynecologic History Patient's last menstrual period was 10/29/2012. Contraception: none Last Pap: ? 2011. Results were: normal  Obstetric History OB History   Grav Para Term Preterm Abortions TAB SAB Ect Mult Living   1 1 1       1      # Outc Date GA Lbr Len/2nd Wgt Sex Del Anes PTL Lv   1 TRM                The following portions of the patient's history were reviewed and updated as appropriate: allergies, current medications, past family history, past medical history, past social history, past surgical history and problem list.  Review of Systems Pertinent items are noted in HPI.    Objective:    General appearance: alert Breasts: normal appearance, no masses or tenderness Abdomen: soft, non-tender; bowel sounds normal; no masses,  no organomegaly Pelvic: cervix normal in appearance, external genitalia normal, no adnexal masses or tenderness, uterus normal size, shape, and consistency and vagina normal without discharge      Assessment:    Healthy female exam.    Plan:    Resume continous Nuva ring; f/u prn

## 2012-11-05 NOTE — Patient Instructions (Addendum)

## 2012-11-06 ENCOUNTER — Encounter: Payer: Self-pay | Admitting: Obstetrics & Gynecology

## 2012-11-06 DIAGNOSIS — Z01419 Encounter for gynecological examination (general) (routine) without abnormal findings: Secondary | ICD-10-CM | POA: Insufficient documentation

## 2012-11-06 DIAGNOSIS — N803 Endometriosis of pelvic peritoneum: Secondary | ICD-10-CM | POA: Insufficient documentation

## 2012-11-06 LAB — HIV ANTIBODY (ROUTINE TESTING W REFLEX): HIV: NONREACTIVE

## 2012-11-11 ENCOUNTER — Encounter: Payer: Self-pay | Admitting: Obstetrics & Gynecology

## 2012-11-11 DIAGNOSIS — IMO0002 Reserved for concepts with insufficient information to code with codable children: Secondary | ICD-10-CM | POA: Insufficient documentation

## 2012-11-11 LAB — PAP IG, CT-NG NAA, HPV HIGH-RISK: HPV DNA High Risk: DETECTED — AB

## 2013-11-12 ENCOUNTER — Encounter (HOSPITAL_COMMUNITY): Payer: Self-pay | Admitting: *Deleted

## 2013-11-12 ENCOUNTER — Inpatient Hospital Stay (HOSPITAL_COMMUNITY)
Admission: AD | Admit: 2013-11-12 | Discharge: 2013-11-12 | Disposition: A | Discharge status: Home / Self Care | Payer: 59 | Source: Ambulatory Visit | Attending: Obstetrics | Admitting: Obstetrics

## 2013-11-12 DIAGNOSIS — R109 Unspecified abdominal pain: Secondary | ICD-10-CM | POA: Insufficient documentation

## 2013-11-12 DIAGNOSIS — Z87891 Personal history of nicotine dependence: Secondary | ICD-10-CM | POA: Insufficient documentation

## 2013-11-12 DIAGNOSIS — N803 Endometriosis of pelvic peritoneum, unspecified: Secondary | ICD-10-CM | POA: Insufficient documentation

## 2013-11-12 DIAGNOSIS — F411 Generalized anxiety disorder: Secondary | ICD-10-CM | POA: Insufficient documentation

## 2013-11-12 DIAGNOSIS — D259 Leiomyoma of uterus, unspecified: Secondary | ICD-10-CM | POA: Insufficient documentation

## 2013-11-12 DIAGNOSIS — D251 Intramural leiomyoma of uterus: Secondary | ICD-10-CM | POA: Insufficient documentation

## 2013-11-12 DIAGNOSIS — N949 Unspecified condition associated with female genital organs and menstrual cycle: Secondary | ICD-10-CM | POA: Insufficient documentation

## 2013-11-12 DIAGNOSIS — N946 Dysmenorrhea, unspecified: Secondary | ICD-10-CM

## 2013-11-12 DIAGNOSIS — R030 Elevated blood-pressure reading, without diagnosis of hypertension: Secondary | ICD-10-CM | POA: Insufficient documentation

## 2013-11-12 DIAGNOSIS — IMO0001 Reserved for inherently not codable concepts without codable children: Secondary | ICD-10-CM

## 2013-11-12 LAB — POCT PREGNANCY, URINE: Preg Test, Ur: NEGATIVE

## 2013-11-12 LAB — URINE MICROSCOPIC-ADD ON

## 2013-11-12 LAB — URINALYSIS, ROUTINE W REFLEX MICROSCOPIC
Bilirubin Urine: NEGATIVE
GLUCOSE, UA: NEGATIVE mg/dL
KETONES UR: NEGATIVE mg/dL
LEUKOCYTES UA: NEGATIVE
Nitrite: NEGATIVE
PH: 5.5 (ref 5.0–8.0)
Protein, ur: 30 mg/dL — AB
Specific Gravity, Urine: >1.03 — ABNORMAL HIGH (ref 1.005–1.030)
Urobilinogen, UA: 0.2 mg/dL (ref 0.0–1.0)

## 2013-11-12 LAB — WET PREP, GENITAL
Trich, Wet Prep: NONE SEEN
WBC, Wet Prep HPF POC: NONE SEEN
YEAST WET PREP: NONE SEEN

## 2013-11-12 MED ORDER — TRAMADOL HCL 50 MG PO TABS
50.0000 mg | ORAL_TABLET | Freq: Once | ORAL | Status: AC
  Administered 2013-11-12: 50 mg via ORAL
  Filled 2013-11-12: qty 1

## 2013-11-12 MED ORDER — TRAMADOL HCL 50 MG PO TABS: 50.0000 mg | ORAL_TABLET | Freq: Four times a day (QID) | ORAL | Status: DC | PRN

## 2013-11-12 NOTE — MAU Provider Note (Signed)
CC: Abdominal Pain    First Provider Initiated Contact with Patient 11/12/13 1123      HPI Danielle Dawson is a 33 y.o. G1P1001 who presents with yesterday of severe pelvic pain and cramping. Menses started today. She tried ibuprofen 800 mg last night and this morning without improvement. [place]. Experiences nausea with narcotics and states Ultram has helped with her pain in the past. Denies dysuria, hematuria, frequency or urgency of urination. Female sexual partner. Requests STI tests. Past Medical History  Diagnosis Date  . Endometriosis   . Fibroid   . Anxiety   08/2013: 1.6 cm intramural fibroid on US  OB History  Gravida Para Term Preterm AB SAB TAB Ectopic Multiple Living  1 1 1       1     # Outcome Date GA Lbr Len/2nd Weight Sex Delivery Anes PTL Lv  1 TRM               Past Surgical History  Procedure Laterality Date  . No past surgeries      History   Social History  . Marital Status: Single    Spouse Name: N/A    Number of Children: N/A  . Years of Education: N/A   Occupational History  . Not on file.   Social History Main Topics  . Smoking status: Former Smoker    Types: Cigars    Quit date: 06/01/2012  . Smokeless tobacco: Not on file  . Alcohol Use: No     Comment: socially  . Drug Use: No  . Sexual Activity: Yes    Partners: Female    Birth Control/ Protection: Inserts     Comment: cycle control   Other Topics Concern  . Not on file   Social History Narrative  . No narrative on file    No current facility-administered medications on file prior to encounter.   Current Outpatient Prescriptions on File Prior to Encounter  Medication Sig Dispense Refill  . Multiple Vitamin (ONE-A-DAY ESSENTIAL PO) Take 1 tablet by mouth daily.        No Known Allergies  ROS Pertinent items in HPI  PHYSICAL EXAM Filed Vitals:   11/12/13 0920  BP: 146/94  Pulse: 68   Temp: 98.2 F (36.8 C)  Resp: 22   General: Well nourished, well developed female in apparent pain Cardiovascular: Normal rate Respiratory: Normal effort Abdomen: Soft, nontender Back: No CVAT Extremities: No edema Neurologic: Alert and oriented Speculum exam: NEFG; vagina with physiologic discharge, heavy menstrual blood flow; cervix clean Bimanual exam: cervix closed, no CMT; uterus moderately tender L>R, NSSP;left adnexal tenderness, no adnexal  Masses felt   LAB RESULTS Results for orders placed during the hospital encounter of 11/12/13 (from the past 24 hour(s))  URINALYSIS, ROUTINE W REFLEX MICROSCOPIC     Status: Abnormal   Collection Time    11/12/13  9:25 AM      Result Value Ref Range   Color, Urine YELLOW  YELLOW   APPearance CLEAR  CLEAR   Specific Gravity, Urine >1.030 (*) 1.005 - 1.030   pH 5.5  5.0 - 8.0   Glucose, UA NEGATIVE  NEGATIVE mg/dL   Hgb urine dipstick LARGE (*) NEGATIVE   Bilirubin Urine NEGATIVE  NEGATIVE   Ketones, ur NEGATIVE  NEGATIVE mg/dL   Protein, ur 30 (*) NEGATIVE mg/dL   Urobilinogen, UA 0.2  0.0 - 1.0 mg/dL   Nitrite NEGATIVE  NEGATIVE   Leukocytes, UA NEGATIVE  NEGATIVE  URINE MICROSCOPIC-ADD ON     Status: Abnormal   Collection Time    11/12/13  9:25 AM      Result Value Ref Range   Squamous Epithelial / LPF FEW (*) RARE   RBC / HPF 21-50  <3 RBC/hpf   Bacteria, UA RARE  RARE  POCT PREGNANCY, URINE     Status: None   Collection Time    11/12/13  9:34 AM      Result Value Ref Range   Preg Test, Ur NEGATIVE  NEGATIVE  WET PREP, GENITAL     Status: Abnormal   Collection Time    11/12/13 11:45 AM      Result Value Ref Range   Yeast Wet Prep HPF POC NONE SEEN  NONE SEEN   Trich, Wet Prep NONE SEEN  NONE SEEN   Clue Cells Wet Prep HPF POC FEW (*) NONE SEEN   WBC, Wet Prep HPF POC NONE SEEN  NONE SEEN    IMAGING No results found.  MAU COURSE GC/CT sent Tramadol 50 mg po given with some relief ASSESSMENT  1.  Dysmenorrhea   2. Endometriosis of pelvic peritoneum   3. Blood pressure elevated   Small fibroid  PLAN Discharge home. See AVS for patient education. Work excuse   Medication List    TAKE these medications       traMADol 50 MG tablet  Commonly known as:  ULTRAM  Take 1 tablet (50 mg total) by mouth every 6 (six) hours as needed.      ASK your doctor about these medications       ibuprofen 200 MG tablet  Commonly known as:  ADVIL,MOTRIN  Take 800 mg by mouth every 6 (six) hours as needed for moderate pain.     ONE-A-DAY ESSENTIAL PO  Take 1 tablet by mouth daily.       Have BP recheck in 6 weeks.   Follow-up Information   Follow up with Agnes Lawrence, MD In 1 month.   Specialty:  Obstetrics and Gynecology   Contact information:   [address] [place] [address] [phone]      Lorene Dy, Riner 11/12/2013 11:30 AM

## 2013-11-12 NOTE — MAU Note (Signed)
Patient states she started her period yesterday and is having abdominal cramping. OTC medication in not working.

## 2013-11-12 NOTE — Discharge Instructions (Signed)
Dysmenorrhea °Menstrual cramps (dysmenorrhea) are caused by the muscles of the uterus tightening (contracting) during a menstrual period. For some women, this discomfort is merely bothersome. For others, dysmenorrhea can be severe enough to interfere with everyday activities for a few days each month. °Primary dysmenorrhea is menstrual cramps that last a couple of days when you start having menstrual periods or soon after. This often begins after a teenager starts having her period. As a woman gets older or has a baby, the cramps will usually lessen or disappear. Secondary dysmenorrhea begins later in life, lasts longer, and the pain may be stronger than primary dysmenorrhea. The pain may start before the period and last a few days after the period.  °CAUSES  °Dysmenorrhea is usually caused by an underlying problem, such as: °· The tissue lining the uterus grows outside of the uterus in other areas of the body (endometriosis). °· The endometrial tissue, which normally lines the uterus, is found in or grows into the muscular walls of the uterus (adenomyosis). °· The pelvic blood vessels are engorged with blood just before the menstrual period (pelvic congestive syndrome). °· Overgrowth of cells (polyps) in the lining of the uterus or cervix. °· Falling down of the uterus (prolapse) because of loose or stretched ligaments. °· Depression. °· Bladder problems, infection, or inflammation. °· Problems with the intestine, a tumor, or irritable bowel syndrome. °· Cancer of the female organs or bladder. °· A severely tipped uterus. °· A very tight opening or closed cervix. °· Noncancerous tumors of the uterus (fibroids). °· Pelvic inflammatory disease (PID). °· Pelvic scarring (adhesions) from a previous surgery. °· Ovarian cyst. °· An intrauterine device (IUD) used for birth control. °RISK FACTORS °You may be at greater risk of dysmenorrhea if: °· You are younger than age 30. °· You started puberty early. °· You have  irregular or heavy bleeding. °· You have never given birth. °· You have a family history of this problem. °· You are a smoker. °SIGNS AND SYMPTOMS  °· Cramping or throbbing pain in your lower abdomen. °· Headaches. °· Lower back pain. °· Nausea or vomiting. °· Diarrhea. °· Sweating or dizziness. °· Loose stools. °DIAGNOSIS  °A diagnosis is based on your history, symptoms, physical exam, diagnostic tests, or procedures. Diagnostic tests or procedures may include: °· Blood tests. °· Ultrasonography. °· An examination of the lining of the uterus (dilation and curettage, D&C). °· An examination inside your abdomen or pelvis with a scope (laparoscopy). °· X-rays. °· CT scan. °· MRI. °· An examination inside the bladder with a scope (cystoscopy). °· An examination inside the intestine or stomach with a scope (colonoscopy, gastroscopy). °TREATMENT  °Treatment depends on the cause of the dysmenorrhea. Treatment may include: °· Pain medicine prescribed by your health care provider. °· Birth control pills or an IUD with progesterone hormone in it. °· Hormone replacement therapy. °· Nonsteroidal anti-inflammatory drugs (NSAIDs). These may help stop the production of prostaglandins. °· Surgery to remove adhesions, endometriosis, ovarian cyst, or fibroids. °· Removal of the uterus (hysterectomy). °· Progesterone shots to stop the menstrual period. °· Cutting the nerves on the sacrum that go to the female organs (presacral neurectomy). °· Electric current to the sacral nerves (sacral nerve stimulation). °· Antidepressant medicine. °· Psychiatric therapy, counseling, or group therapy. °· Exercise and physical therapy. °· Meditation and yoga therapy. °· Acupuncture. °HOME CARE INSTRUCTIONS  °· Only take over-the-counter or prescription medicines as directed by your health care provider. °· Place a heating pad   or hot water bottle on your lower back or abdomen. Do not sleep with the heating pad.  Use aerobic exercises, walking,  swimming, biking, and other exercises to help lessen the cramping.  Massage to the lower back or abdomen may help.  Stop smoking.  Avoid alcohol and caffeine. SEEK MEDICAL CARE IF:   Your pain does not get better with medicine.  You have pain with sexual intercourse.  Your pain increases and is not controlled with medicines.  You have abnormal vaginal bleeding with your period.  You develop nausea or vomiting with your period that is not controlled with medicine. SEEK IMMEDIATE MEDICAL CARE IF:  You pass out.  Document Released: 07/02/2005 Document Revised: 03/04/2013 Document Reviewed: 12/18/2012 [facility] Patient Information 2014 [place].  Endometriosis Endometriosis is a condition in which the tissue that lines the uterus (endometrium) grows outside of its normal location. The tissue may grow in many locations close to the uterus, but it commonly grows on the ovaries, fallopian tubes, vagina, or bowel. Because the uterus expels, or sheds, its lining every menstrual cycle, there is bleeding wherever the endometrial tissue is located. This can cause pain because blood is irritating to tissues not normally exposed to it.  CAUSES  The cause of endometriosis is not known.  SIGNS AND SYMPTOMS  Often, there are no symptoms. When symptoms are present, they can vary with the location of the displaced tissue. Various symptoms can occur at different times. Although symptoms occur mainly during a woman's menstrual period, they can also occur midcycle and usually stop with menopause. Some people may go months with no symptoms at all. Symptoms may include:   Back or abdominal pain.   Heavier bleeding during periods.   Pain during intercourse.   Painful bowel movements.   Infertility. DIAGNOSIS  Your health care provider will do a physical exam and ask about your symptoms. Various tests may be done, such as:   Blood tests and urine tests. These are done to help rule out other  problems.   Ultrasound. This test is done to look for abnormal tissue.   An X-ray of the lower bowel (barium enema).  Laparoscopy. In this procedure, a thin, lighted tube with a tiny camera on the end (laparoscope) is inserted into your abdomen. This helps your health care provider look for abnormal tissue to confirm the diagnosis. The health care provider may also remove a small piece of tissue (biopsy) from any abnormal tissue found. This tissue sample can then be sent to a lab so it can be looked at under a microscope. TREATMENT  Treatment will vary and may include:   Medicines to relieve pain. Nonsteroidal anti-inflammatory drugs (NSAIDs) are a type of pain medicine that can help to relieve the pain caused by endometriosis.  Hormonal therapy. When using hormonal therapy, periods are eliminated. This eliminates the monthly exposure to blood by the displaced endometrial tissue.   Surgery. Surgery may sometimes be done to remove the abnormal endometrial tissue. In severe cases, surgery may be done to remove the fallopian tubes, uterus, and ovaries (hysterectomy). HOME CARE INSTRUCTIONS   Only take over-the-counter or prescription medicines for pain, discomfort, or fever as directed by your health care provider. Do not take aspirin because it may increase bleeding when you are not on hormonal therapy.   Avoid activities that produce pain, including sexual activity. SEEK MEDICAL CARE IF:  You have pelvic pain before, after, or during your periods.  You have pelvic pain between periods  that gets worse during your period.  You have pelvic pain during or after sex.  You have pelvic pain with bowel movements or urination, especially during your period.  You have problems getting pregnant. SEEK IMMEDIATE MEDICAL CARE IF:   Your pain is severe and is not responding to pain medicine.   You have severe nausea and vomiting, or you cannot keep foods down.   You have pain that is  limited to the right lower part of your abdomen.   You have swelling or increasing pain in your abdomen.   You see blood in your stool.   You have a fever or persistent symptoms for more than 2 3 days.   You have a fever and your symptoms suddenly get worse. MAKE SURE YOU:   Understand these instructions.  Will watch your condition.  Will get help right away if you are not doing well or get worse. Document Released: 06/29/2000 Document Revised: 04/22/2013 Document Reviewed: 02/27/2013 [facility] Patient Information 2014 [place].

## 2013-11-12 NOTE — MAU Note (Signed)
Patient reports taking 800mg  Ibuprofen at 6am this morning for the abdominal cramping.

## 2013-11-13 LAB — GC/CHLAMYDIA PROBE AMP
CT Probe RNA: NEGATIVE
GC Probe RNA: NEGATIVE

## 2014-02-18 ENCOUNTER — Encounter (HOSPITAL_COMMUNITY): Payer: Self-pay | Admitting: *Deleted

## 2014-02-18 ENCOUNTER — Inpatient Hospital Stay (HOSPITAL_COMMUNITY)
Admission: AD | Admit: 2014-02-18 | Discharge: 2014-02-19 | Disposition: A | Discharge status: Home / Self Care | Payer: 59 | Source: Ambulatory Visit | Attending: Obstetrics | Admitting: Obstetrics

## 2014-02-18 DIAGNOSIS — N949 Unspecified condition associated with female genital organs and menstrual cycle: Secondary | ICD-10-CM | POA: Insufficient documentation

## 2014-02-18 DIAGNOSIS — N925 Other specified irregular menstruation: Secondary | ICD-10-CM | POA: Insufficient documentation

## 2014-02-18 DIAGNOSIS — N938 Other specified abnormal uterine and vaginal bleeding: Secondary | ICD-10-CM | POA: Insufficient documentation

## 2014-02-18 DIAGNOSIS — R102 Pelvic and perineal pain: Secondary | ICD-10-CM

## 2014-02-18 DIAGNOSIS — F17200 Nicotine dependence, unspecified, uncomplicated: Secondary | ICD-10-CM | POA: Insufficient documentation

## 2014-02-18 DIAGNOSIS — R109 Unspecified abdominal pain: Secondary | ICD-10-CM | POA: Insufficient documentation

## 2014-02-18 NOTE — MAU Note (Signed)
LLQ pain since 530 pm. States has endometriosis. Vaginal bleeding since yesterday; thinks she's on her period; heavy flow, states normally has heavy flow but this is a little heavier than normal with clots.

## 2014-02-19 DIAGNOSIS — N949 Unspecified condition associated with female genital organs and menstrual cycle: Secondary | ICD-10-CM

## 2014-02-19 LAB — URINALYSIS, ROUTINE W REFLEX MICROSCOPIC
Bilirubin Urine: NEGATIVE
GLUCOSE, UA: NEGATIVE mg/dL
Ketones, ur: 15 mg/dL — AB
LEUKOCYTES UA: NEGATIVE
Nitrite: NEGATIVE
PH: 6 (ref 5.0–8.0)
Protein, ur: NEGATIVE mg/dL
Specific Gravity, Urine: 1.025 (ref 1.005–1.030)
Urobilinogen, UA: 0.2 mg/dL (ref 0.0–1.0)

## 2014-02-19 LAB — URINE MICROSCOPIC-ADD ON

## 2014-02-19 LAB — POCT PREGNANCY, URINE
PREG TEST UR: NEGATIVE
Preg Test, Ur: NEGATIVE

## 2014-02-19 MED ORDER — KETOROLAC TROMETHAMINE 10 MG PO TABS: 10.0000 mg | ORAL_TABLET | Freq: Four times a day (QID) | ORAL | Status: DC | PRN

## 2014-02-19 MED ORDER — KETOROLAC TROMETHAMINE 60 MG/2ML IM SOLN
60.0000 mg | Freq: Once | INTRAMUSCULAR | Status: AC
  Administered 2014-02-19: 60 mg via INTRAMUSCULAR
  Filled 2014-02-19: qty 2

## 2014-02-19 MED ORDER — KETOROLAC TROMETHAMINE 60 MG/2ML IM SOLN: 60.0000 mg | Freq: Once | INTRAMUSCULAR | Status: DC

## 2014-02-19 NOTE — Discharge Instructions (Signed)
Endometriosis Endometriosis is a condition in which the tissue that lines the uterus (endometrium) grows outside of its normal location. The tissue may grow in many locations close to the uterus, but it commonly grows on the ovaries, fallopian tubes, vagina, or bowel. Because the uterus expels, or sheds, its lining every menstrual cycle, there is bleeding wherever the endometrial tissue is located. This can cause pain because blood is irritating to tissues not normally exposed to it.  CAUSES  The cause of endometriosis is not known.  SIGNS AND SYMPTOMS  Often, there are no symptoms. When symptoms are present, they can vary with the location of the displaced tissue. Various symptoms can occur at different times. Although symptoms occur mainly during a woman's menstrual period, they can also occur midcycle and usually stop with menopause. Some people may go months with no symptoms at all. Symptoms may include:   Back or abdominal pain.   Heavier bleeding during periods.   Pain during intercourse.   Painful bowel movements.   Infertility. DIAGNOSIS  Your health care provider will do a physical exam and ask about your symptoms. Various tests may be done, such as:   Blood tests and urine tests. These are done to help rule out other problems.   Ultrasound. This test is done to look for abnormal tissue.   An X-ray of the lower bowel (barium enema).  Laparoscopy. In this procedure, a thin, lighted tube with a tiny camera on the end (laparoscope) is inserted into your abdomen. This helps your health care provider look for abnormal tissue to confirm the diagnosis. The health care provider may also remove a small piece of tissue (biopsy) from any abnormal tissue found. This tissue sample can then be sent to a lab so it can be looked at under a microscope. TREATMENT  Treatment will vary and may include:   Medicines to relieve pain. Nonsteroidal anti-inflammatory drugs (NSAIDs) are a type of  pain medicine that can help to relieve the pain caused by endometriosis.  Hormonal therapy. When using hormonal therapy, periods are eliminated. This eliminates the monthly exposure to blood by the displaced endometrial tissue.   Surgery. Surgery may sometimes be done to remove the abnormal endometrial tissue. In severe cases, surgery may be done to remove the fallopian tubes, uterus, and ovaries (hysterectomy). HOME CARE INSTRUCTIONS   Take all medicines as directed by your health care provider. Do not take aspirin because it may increase bleeding when you are not on hormonal therapy.   Avoid activities that produce pain, including sexual activity. SEEK MEDICAL CARE IF:  You have pelvic pain before, after, or during your periods.  You have pelvic pain between periods that gets worse during your period.  You have pelvic pain during or after sex.  You have pelvic pain with bowel movements or urination, especially during your period.  You have problems getting pregnant.  You have a fever. SEEK IMMEDIATE MEDICAL CARE IF:   Your pain is severe and is not responding to pain medicine.   You have severe nausea and vomiting, or you cannot keep foods down.   You have pain that is limited to the right lower part of your abdomen.   You have swelling or increasing pain in your abdomen.   You see blood in your stool.  MAKE SURE YOU:   Understand these instructions.  Will watch your condition.  Will get help right away if you are not doing well or get worse. Document Released: 06/29/2000 Document   Revised: 11/16/2013 Document Reviewed: 02/27/2013 [employer] Patient Information 2015 [employer]. This information is not intended to replace advice given to you by your health care provider. Make sure you discuss any questions you have with your health care provider.  

## 2014-02-19 NOTE — MAU Provider Note (Signed)
History     CSN: [id number]  Arrival date and time: 02/18/14 2137   First Provider Initiated Contact with Patient 02/19/14 [phone]      Chief Complaint  Patient presents with  . Abdominal Pain  . Endometriosis   HPI  Danielle Dawson is a 33 y.o. G1P1001 who presents today with abdominal pain. She states that the pain started around 1730, and she took 800 mg ibuprofen and tramadol at that time. She states that she is having vaginal bleeding. She states that the bleeding started the day prior to when the pain started. She states that she normally has a lot of pain with her periods, and that she has endometriosis. She states that she sees Dr. Delsa Sale, and she saw her last year. She did not call the office about her pain. She states that sometimes the tramadol works and sometimes it doesn't.  Past Medical History  Diagnosis Date  . Endometriosis   . Fibroid   . Anxiety     Past Surgical History  Procedure Laterality Date  . No past surgeries      Family History  Problem Relation Age of Onset  . Diabetes Maternal Grandfather   . Hypertension Maternal Grandfather   . Hypertension Mother   . Heart disease Father   . Diabetes Maternal Grandmother   . Vision loss Maternal Grandmother     History  Substance Use Topics  . Smoking status: Current Every Day Smoker    Types: Cigars    Last Attempt to Quit: 06/01/2012  . Smokeless tobacco: Not on file  . Alcohol Use: Yes     Comment: socially    Allergies: No Known Allergies  Prescriptions prior to admission  Medication Sig Dispense Refill  . ibuprofen (ADVIL,MOTRIN) 200 MG tablet Take 800 mg by mouth every 6 (six) hours as needed for moderate pain.      . Multiple Vitamin (ONE-A-DAY ESSENTIAL PO) Take 1 tablet by mouth daily.      . traMADol (ULTRAM) 50 MG tablet Take 1 tablet (50 mg total) by mouth every 6 (six) hours as needed.  30 tablet  0    ROS Physical Exam   Blood pressure 134/100, pulse 76, resp. rate 22,  last menstrual period 02/17/2014.  Physical Exam  Nursing note and vitals reviewed. Constitutional: She is oriented to person, place, and time. She appears well-developed and well-nourished. No distress.  Cardiovascular: Normal rate.   Respiratory: Effort normal.  GI: Soft.  Neurological: She is alert and oriented to person, place, and time.  Skin: Skin is warm and dry.  Psychiatric: She has a normal mood and affect.   0218: Patient reports that her pain is better now. She would like to go home, and refuses pelvic exam at this time.  MAU Course  Procedures Results for orders placed during the hospital encounter of 02/18/14 (from the past 24 hour(s))  POCT PREGNANCY, URINE     Status: None   Collection Time    02/19/14 12:41 AM      Result Value Ref Range   Preg Test, Ur NEGATIVE  NEGATIVE  POCT PREGNANCY, URINE     Status: None   Collection Time    02/19/14 12:51 AM      Result Value Ref Range   Preg Test, Ur NEGATIVE  NEGATIVE    Assessment and Plan   1. Pelvic pain in female    Comfort measures reviewed   Medication List    STOP taking these  medications       ibuprofen 200 MG tablet  Commonly known as:  ADVIL,MOTRIN      TAKE these medications       ketorolac 10 MG tablet  Commonly known as:  TORADOL  Take 1 tablet (10 mg total) by mouth every 6 (six) hours as needed.     ONE-A-DAY ESSENTIAL PO  Take 1 tablet by mouth daily.     traMADol 50 MG tablet  Commonly known as:  ULTRAM  Take 1 tablet (50 mg total) by mouth every 6 (six) hours as needed.       Follow-up Information   Schedule an appointment as soon as possible for a visit with [facility].   Specialty:  Obstetrics and Gynecology   Contact information:   [address] [place] [address] [phone]       Mathis Bud 02/19/2014, 12:59 AM

## 2014-03-16 ENCOUNTER — Encounter (HOSPITAL_COMMUNITY): Payer: Self-pay | Admitting: *Deleted

## 2014-03-16 ENCOUNTER — Inpatient Hospital Stay (HOSPITAL_COMMUNITY): Payer: 59

## 2014-03-16 ENCOUNTER — Inpatient Hospital Stay (HOSPITAL_COMMUNITY)
Admission: AD | Admit: 2014-03-16 | Discharge: 2014-03-16 | Disposition: A | Discharge status: Home / Self Care | Payer: 59 | Source: Ambulatory Visit | Attending: Obstetrics & Gynecology | Admitting: Obstetrics & Gynecology

## 2014-03-16 DIAGNOSIS — N938 Other specified abnormal uterine and vaginal bleeding: Secondary | ICD-10-CM | POA: Diagnosis not present

## 2014-03-16 DIAGNOSIS — N949 Unspecified condition associated with female genital organs and menstrual cycle: Secondary | ICD-10-CM | POA: Diagnosis not present

## 2014-03-16 DIAGNOSIS — R109 Unspecified abdominal pain: Secondary | ICD-10-CM | POA: Insufficient documentation

## 2014-03-16 DIAGNOSIS — F17200 Nicotine dependence, unspecified, uncomplicated: Secondary | ICD-10-CM | POA: Diagnosis not present

## 2014-03-16 DIAGNOSIS — Z8742 Personal history of other diseases of the female genital tract: Secondary | ICD-10-CM | POA: Diagnosis not present

## 2014-03-16 DIAGNOSIS — N926 Irregular menstruation, unspecified: Secondary | ICD-10-CM

## 2014-03-16 DIAGNOSIS — N939 Abnormal uterine and vaginal bleeding, unspecified: Secondary | ICD-10-CM

## 2014-03-16 DIAGNOSIS — R102 Pelvic and perineal pain: Secondary | ICD-10-CM

## 2014-03-16 LAB — CBC
HCT: 31.6 % — ABNORMAL LOW (ref 36.0–46.0)
HEMOGLOBIN: 10 g/dL — AB (ref 12.0–15.0)
MCH: 22.9 pg — ABNORMAL LOW (ref 26.0–34.0)
MCHC: 31.6 g/dL (ref 30.0–36.0)
MCV: 72.3 fL — ABNORMAL LOW (ref 78.0–100.0)
Platelets: 404 10*3/uL — ABNORMAL HIGH (ref 150–400)
RBC: 4.37 MIL/uL (ref 3.87–5.11)
RDW: 20.7 % — ABNORMAL HIGH (ref 11.5–15.5)
WBC: 9.3 10*3/uL (ref 4.0–10.5)

## 2014-03-16 LAB — URINALYSIS, ROUTINE W REFLEX MICROSCOPIC
Bilirubin Urine: NEGATIVE
Glucose, UA: NEGATIVE mg/dL
KETONES UR: NEGATIVE mg/dL
Leukocytes, UA: NEGATIVE
NITRITE: NEGATIVE
Protein, ur: NEGATIVE mg/dL
Specific Gravity, Urine: 1.025 (ref 1.005–1.030)
UROBILINOGEN UA: 0.2 mg/dL (ref 0.0–1.0)
pH: 7 (ref 5.0–8.0)

## 2014-03-16 LAB — WET PREP, GENITAL
TRICH WET PREP: NONE SEEN
Yeast Wet Prep HPF POC: NONE SEEN

## 2014-03-16 LAB — POCT PREGNANCY, URINE: Preg Test, Ur: NEGATIVE

## 2014-03-16 LAB — URINE MICROSCOPIC-ADD ON

## 2014-03-16 MED ORDER — IBUPROFEN 800 MG PO TABS: 800.0000 mg | ORAL_TABLET | Freq: Three times a day (TID) | ORAL | Status: DC

## 2014-03-16 MED ORDER — KETOROLAC TROMETHAMINE 60 MG/2ML IM SOLN
60.0000 mg | Freq: Once | INTRAMUSCULAR | Status: AC
  Administered 2014-03-16: 60 mg via INTRAMUSCULAR
  Filled 2014-03-16: qty 2

## 2014-03-16 MED ORDER — NORGESTIMATE-ETH ESTRADIOL 0.25-35 MG-MCG PO TABS: ORAL_TABLET | ORAL | Status: DC

## 2014-03-16 NOTE — MAU Note (Signed)
"  endometriosis". Cycle started on Sunday.  Pain got worse last night. Called office, was instructed to come here.  Cycle is heavier than usual

## 2014-03-16 NOTE — MAU Provider Note (Signed)
History     CSN: [id number]  Arrival date and time: 03/16/14 1318   First Provider Initiated Contact with Patient 03/16/14 1432      Chief Complaint  Patient presents with  . Abdominal Pain  . Vaginal Bleeding   HPI  Pt is not pregnant and presents with left abdominal pain- pt has known history of endometriosis Pt has been on NuvaRing, but not this month..  Pt has had heavy bleeding. Pt is sexually active.  Pt has had nausea/vomiting  With this pain.  Pt took Tramadol 11 am this morning and also Advil at 10:15 am.  Pt has changed her pad 3 times this morning- becoming heavier.   Past Medical History  Diagnosis Date  . Endometriosis   . Fibroid   . Anxiety     Past Surgical History  Procedure Laterality Date  . No past surgeries      Family History  Problem Relation Age of Onset  . Diabetes Maternal Grandfather   . Hypertension Maternal Grandfather   . Hypertension Mother   . Heart disease Father   . Diabetes Maternal Grandmother   . Vision loss Maternal Grandmother     History  Substance Use Topics  . Smoking status: Current Every Day Smoker    Types: Cigars    Last Attempt to Quit: 06/01/2012  . Smokeless tobacco: Not on file  . Alcohol Use: Yes     Comment: socially    Allergies: No Known Allergies  Prescriptions prior to admission  Medication Sig Dispense Refill  . ketorolac (TORADOL) 10 MG tablet Take 1 tablet (10 mg total) by mouth every 6 (six) hours as needed.  20 tablet  0  . Multiple Vitamin (ONE-A-DAY ESSENTIAL PO) Take 1 tablet by mouth daily.      . traMADol (ULTRAM) 50 MG tablet Take 1 tablet (50 mg total) by mouth every 6 (six) hours as needed.  30 tablet  0    ROS Physical Exam   Blood pressure 143/90, pulse 56, temperature 98.5 F (36.9 C), temperature source Oral, resp. rate 18, height 5' 3.5" (1.613 m), weight 193 lb (87.544 kg), last menstrual period 03/14/2014.  Physical Exam  Nursing note and vitals reviewed. Constitutional:  She is oriented to person, place, and time. She appears well-developed and well-nourished. No distress.  HENT:  Head: Normocephalic.  Eyes: Pupils are equal, round, and reactive to light.  Neck: Normal range of motion. Neck supple.  Cardiovascular: Normal rate.   Respiratory: Effort normal.  GI: Soft.  Genitourinary: Cervix exhibits no motion tenderness.  Mod amount of bright red blood in vault; cervix clean, NT; Uterus retroverted fixed; adnexa without palpable enlargement or tenderness  Musculoskeletal: Normal range of motion.  Neurological: She is alert and oriented to person, place, and time.  Skin: Skin is warm and dry.  Psychiatric: She has a normal mood and affect.    MAU Course  Procedures Toradlol 60mg  IM Pain went down to 2/10 Results for orders placed during the hospital encounter of 03/16/14 (from the past 24 hour(s))  URINALYSIS, ROUTINE W REFLEX MICROSCOPIC     Status: Abnormal   Collection Time    03/16/14  2:16 PM      Result Value Ref Range   Color, Urine YELLOW  YELLOW   APPearance CLEAR  CLEAR   Specific Gravity, Urine 1.025  1.005 - 1.030   pH 7.0  5.0 - 8.0   Glucose, UA NEGATIVE  NEGATIVE mg/dL   Hgb urine  dipstick MODERATE (*) NEGATIVE   Bilirubin Urine NEGATIVE  NEGATIVE   Ketones, ur NEGATIVE  NEGATIVE mg/dL   Protein, ur NEGATIVE  NEGATIVE mg/dL   Urobilinogen, UA 0.2  0.0 - 1.0 mg/dL   Nitrite NEGATIVE  NEGATIVE   Leukocytes, UA NEGATIVE  NEGATIVE  URINE MICROSCOPIC-ADD ON     Status: None   Collection Time    03/16/14  2:16 PM      Result Value Ref Range   Squamous Epithelial / LPF RARE  RARE   WBC, UA 0-2  <3 WBC/hpf   RBC / HPF 11-20  <3 RBC/hpf   Bacteria, UA RARE  RARE  POCT PREGNANCY, URINE     Status: None   Collection Time    03/16/14  2:22 PM      Result Value Ref Range   Preg Test, Ur NEGATIVE  NEGATIVE  CBC     Status: Abnormal   Collection Time    03/16/14  3:10 PM      Result Value Ref Range   WBC 9.3  4.0 - 10.5 K/uL    RBC 4.37  3.87 - 5.11 MIL/uL   Hemoglobin 10.0 (*) 12.0 - 15.0 g/dL   HCT 31.6 (*) 36.0 - 46.0 %   MCV 72.3 (*) 78.0 - 100.0 fL   MCH 22.9 (*) 26.0 - 34.0 pg   MCHC 31.6  30.0 - 36.0 g/dL   RDW 20.7 (*) 11.5 - 15.5 %   Platelets 404 (*) 150 - 400 K/uL  US Transvaginal Non-ob  03/16/2014   CLINICAL DATA:  Left lower quadrant pain.  History of endometriosis.  EXAM: TRANSABDOMINAL AND TRANSVAGINAL ULTRASOUND OF PELVIS  TECHNIQUE: Both transabdominal and transvaginal ultrasound examinations of the pelvis were performed. Transabdominal technique was performed for global imaging of the pelvis including uterus, ovaries, adnexal regions, and pelvic cul-de-sac. It was necessary to proceed with endovaginal exam following the transabdominal exam to visualize the adnexa and endometrium.  COMPARISON:  Pelvic ultrasound 09/01/2012.  FINDINGS: Uterus  Measurements: 8.7 x 5.1 x 5.9 cm. Two fibroids are identified. The larger is in the lower uterine segment on the right measuring 2.4 x 1.6 x 1.8 cm. A posterior fibroid near the fundus measures 2.0 x 1.8 x 2.1 cm.  Endometrium  Thickness: 0.8 cm.  No focal abnormality visualized.  Right ovary  Measurements: 2.7 x 2.1 x 1.9 cm. Normal appearance/no adnexal mass.  Left ovary  Measurements: 3.1 x 2.1 x 1.2 cm. Normal appearance/no adnexal mass.  Other findings  There is a moderate volume of free pelvic fluid containing debris. Source for the fluid is not identified.  IMPRESSION: Moderate volume of complex appearing free pelvic fluid. Source for the fluid is not identified. CT abdomen and pelvis with oral and IV contrast for further evaluation if clinically indicated.  2 small uterine fibroids   Electronically Signed   By: Inge Rise M.D.   On: 03/16/2014 15:48   US Pelvis Complete  03/16/2014   CLINICAL DATA:  Left lower quadrant pain.  History of endometriosis.  EXAM: TRANSABDOMINAL AND TRANSVAGINAL ULTRASOUND OF PELVIS  TECHNIQUE: Both transabdominal and transvaginal  ultrasound examinations of the pelvis were performed. Transabdominal technique was performed for global imaging of the pelvis including uterus, ovaries, adnexal regions, and pelvic cul-de-sac. It was necessary to proceed with endovaginal exam following the transabdominal exam to visualize the adnexa and endometrium.  COMPARISON:  Pelvic ultrasound 09/01/2012.  FINDINGS: Uterus  Measurements: 8.7 x 5.1 x 5.9 cm.  Two fibroids are identified. The larger is in the lower uterine segment on the right measuring 2.4 x 1.6 x 1.8 cm. A posterior fibroid near the fundus measures 2.0 x 1.8 x 2.1 cm.  Endometrium  Thickness: 0.8 cm.  No focal abnormality visualized.  Right ovary  Measurements: 2.7 x 2.1 x 1.9 cm. Normal appearance/no adnexal mass.  Left ovary  Measurements: 3.1 x 2.1 x 1.2 cm. Normal appearance/no adnexal mass.  Other findings  There is a moderate volume of free pelvic fluid containing debris. Source for the fluid is not identified.  IMPRESSION: Moderate volume of complex appearing free pelvic fluid. Source for the fluid is not identified. CT abdomen and pelvis with oral and IV contrast for further evaluation if clinically indicated.  2 small uterine fibroids   Electronically Signed   By: Inge Rise M.D.   On: 03/16/2014 15:48   discussed with Dr. Jodi Mourning- will have pt follow up with Dr. Delsa Sale [facility] Chlamydia pending Results for orders placed during the hospital encounter of 03/16/14 (from the past 24 hour(s))  URINALYSIS, ROUTINE W REFLEX MICROSCOPIC     Status: Abnormal   Collection Time    03/16/14  2:16 PM      Result Value Ref Range   Color, Urine YELLOW  YELLOW   APPearance CLEAR  CLEAR   Specific Gravity, Urine 1.025  1.005 - 1.030   pH 7.0  5.0 - 8.0   Glucose, UA NEGATIVE  NEGATIVE mg/dL   Hgb urine dipstick MODERATE (*) NEGATIVE   Bilirubin Urine NEGATIVE  NEGATIVE   Ketones, ur NEGATIVE  NEGATIVE mg/dL   Protein, ur NEGATIVE  NEGATIVE mg/dL   Urobilinogen, UA 0.2  0.0 - 1.0  mg/dL   Nitrite NEGATIVE  NEGATIVE   Leukocytes, UA NEGATIVE  NEGATIVE  URINE MICROSCOPIC-ADD ON     Status: None   Collection Time    03/16/14  2:16 PM      Result Value Ref Range   Squamous Epithelial / LPF RARE  RARE   WBC, UA 0-2  <3 WBC/hpf   RBC / HPF 11-20  <3 RBC/hpf   Bacteria, UA RARE  RARE  POCT PREGNANCY, URINE     Status: None   Collection Time    03/16/14  2:22 PM      Result Value Ref Range   Preg Test, Ur NEGATIVE  NEGATIVE  CBC     Status: Abnormal   Collection Time    03/16/14  3:10 PM      Result Value Ref Range   WBC 9.3  4.0 - 10.5 K/uL   RBC 4.37  3.87 - 5.11 MIL/uL   Hemoglobin 10.0 (*) 12.0 - 15.0 g/dL   HCT 31.6 (*) 36.0 - 46.0 %   MCV 72.3 (*) 78.0 - 100.0 fL   MCH 22.9 (*) 26.0 - 34.0 pg   MCHC 31.6  30.0 - 36.0 g/dL   RDW 20.7 (*) 11.5 - 15.5 %   Platelets 404 (*) 150 - 400 K/uL  WET PREP, GENITAL     Status: Abnormal   Collection Time    03/16/14  4:10 PM      Result Value Ref Range   Yeast Wet Prep HPF POC NONE SEEN  NONE SEEN   Trich, Wet Prep NONE SEEN  NONE SEEN   Clue Cells Wet Prep HPF POC FEW (*) NONE SEEN   WBC, Wet Prep HPF POC FEW (*) NONE SEEN   Assessment and Plan  Abdominal pain Hx of endometriosis AUB- Sprintec 3 for 3 days 2 for 3 days then 1 daily until appointment with Dr. Delsa Sale Make an appointment to see Dr. Delsa Sale   [facility] 03/16/2014, 2:33 PM

## 2014-03-17 LAB — GC/CHLAMYDIA PROBE AMP
CT PROBE, AMP APTIMA: NEGATIVE
GC Probe RNA: NEGATIVE

## 2014-03-18 ENCOUNTER — Encounter: Payer: Self-pay | Admitting: Obstetrics & Gynecology

## 2014-03-18 ENCOUNTER — Ambulatory Visit (INDEPENDENT_AMBULATORY_CARE_PROVIDER_SITE_OTHER): Payer: 59 | Admitting: Obstetrics & Gynecology

## 2014-03-18 VITALS — BP 140/90 | Temp 98.3°F | Ht 64.0 in | Wt 192.0 lb

## 2014-03-18 DIAGNOSIS — D259 Leiomyoma of uterus, unspecified: Secondary | ICD-10-CM

## 2014-03-18 DIAGNOSIS — Z01419 Encounter for gynecological examination (general) (routine) without abnormal findings: Secondary | ICD-10-CM

## 2014-03-18 NOTE — Progress Notes (Signed)
Subjective:     Danielle Dawson is a 33 y.o. female here for a routine exam.      Personal health questionnaire:  Is patient Ashkenazi Jewish, have a family history of breast and/or ovarian cancer: no Is there a family history of uterine cancer diagnosed at age < 13, gastrointestinal cancer, urinary tract cancer, family member who is a Field seismologist syndrome-associated carrier: no Is the patient overweight and hypertensive, family history of diabetes, personal history of gestational diabetes or PCOS: yes Is patient over 19, have PCOS,  family history of premature CHD under age 60, diabetes, smoke, have hypertension or peripheral artery disease:  no At any time, has a partner hit, kicked or otherwise hurt or frightened you?: no Over the past 2 weeks, have you felt down, depressed or hopeless?: no Over the past 2 weeks, have you felt little interest or pleasure in doing things?:no   Gynecologic History Patient's last menstrual period was 03/14/2014. Contraception: OCP (estrogen/progesterone) Last Pap: 4/14. Results were: normal cytology/positive HPV   Obstetric History OB History  Gravida Para Term Preterm AB SAB TAB Ectopic Multiple Living  1 1 1       1     # Outcome Date GA Lbr Len/2nd Weight Sex Delivery Anes PTL Lv  1 TRM               Past Medical History  Diagnosis Date  . Endometriosis   . Fibroid   . Anxiety     Past Surgical History  Procedure Laterality Date  . No past surgeries      Current outpatient prescriptions:BIOTIN PO, Take 1 tablet by mouth daily., Disp: , Rfl: ;  ibuprofen (ADVIL,MOTRIN) 200 MG tablet, Take 400 mg by mouth every 8 (eight) hours as needed for moderate pain., Disp: , Rfl: ;  ketorolac (TORADOL) 10 MG tablet, Take 10 mg by mouth every 6 (six) hours as needed., Disp: , Rfl: ;  Multiple Vitamin (ONE-A-DAY ESSENTIAL PO), Take 1 tablet by mouth daily., Disp: , Rfl:  traMADol (ULTRAM) 50 MG tablet, Take 50 mg by mouth every 6 (six) hours as needed for  moderate pain., Disp: , Rfl: ;  ibuprofen (ADVIL,MOTRIN) 800 MG tablet, Take 1 tablet (800 mg total) by mouth 3 (three) times daily., Disp: 21 tablet, Rfl: 0;  norgestimate-ethinyl estradiol (ORTHO-CYCLEN,SPRINTEC,PREVIFEM) 0.25-35 MG-MCG tablet, Take 3 pills for 3 days then 2 pills for 3 days then 1 pill daily, Disp: 1 Package, Rfl: 11 No Known Allergies  History  Substance Use Topics  . Smoking status: Current Some Day Smoker    Types: Cigars    Last Attempt to Quit: 06/01/2012  . Smokeless tobacco: Never Used  . Alcohol Use: Yes     Comment: socially    Family History  Problem Relation Age of Onset  . Diabetes Maternal Grandfather   . Hypertension Maternal Grandfather   . Hypertension Mother   . Heart disease Father   . Diabetes Maternal Grandmother   . Vision loss Maternal Grandmother       Review of Systems  Constitutional: negative for fatigue and weight loss Respiratory: negative for cough and wheezing Cardiovascular: negative for chest pain, fatigue and palpitations Gastrointestinal: negative for abdominal pain and change in bowel habits Musculoskeletal:negative for myalgias Neurological: negative for gait problems and tremors Behavioral/Psych: negative for abusive relationship, depression Endocrine: negative for temperature intolerance   Genitourinary:negative for abnormal menstrual periods, genital lesions, hot flashes, sexual problems and vaginal discharge Integument/breast: negative for breast lump, breast  tenderness, nipple discharge and skin lesion(s)    Objective:       BP 140/90  Temp(Src) 98.3 F (36.8 C)  Ht 5\' 4"  (1.626 m)  Wt 87.091 kg (192 lb)  BMI 32.94 kg/m2  LMP 03/14/2014 General:   alert  Skin:   no rash or abnormalities  Lungs:   clear to auscultation bilaterally  Heart:   regular rate and rhythm, S1, S2 normal, no murmur, click, rub or gallop  Breasts:   normal without suspicious masses, skin or nipple changes or axillary nodes  Abdomen:   normal findings: no organomegaly, soft, non-tender and no hernia  Pelvis:  External genitalia: normal general appearance Urinary system: urethral meatus normal and bladder without fullness, nontender Vaginal: normal without tenderness, induration or masses Cervix: normal appearance Adnexa: normal bimanual exam Uterus: anteverted and non-tender, normal size   Lab Review Urine pregnancy test Labs reviewed no Radiologic studies reviewed yes     Assessment:    Healthy female exam.  U/S c/w likely ruptured, functional ovarian cyst H/O endometriosis Small fibroid uterus Pap smear 1 yr ago with negative cytology/positive HPV Plan:   Continue NSAIDs Check repeat Pap smear result Orders Placed This Encounter  Procedures  . HIV antibody    Possible management options include: if either cytology abnormal or HPV positive-->colposcopy Follow up as needed.

## 2014-03-19 DIAGNOSIS — D259 Leiomyoma of uterus, unspecified: Secondary | ICD-10-CM | POA: Insufficient documentation

## 2014-03-19 LAB — PAP IG AND HPV HIGH-RISK: HPV DNA High Risk: NOT DETECTED

## 2014-03-19 LAB — HIV ANTIBODY (ROUTINE TESTING W REFLEX): HIV: NONREACTIVE

## 2014-03-19 NOTE — Patient Instructions (Signed)
Ovarian Cyst An ovarian cyst is a fluid-filled sac that forms on an ovary. The ovaries are small organs that produce eggs in women. Various types of cysts can form on the ovaries. Most are not cancerous. Many do not cause problems, and they often go away on their own. Some may cause symptoms and require treatment. Common types of ovarian cysts include:  Functional cysts--These cysts may occur every month during the menstrual cycle. This is normal. The cysts usually go away with the next menstrual cycle if the woman does not get pregnant. Usually, there are no symptoms with a functional cyst.  Endometrioma cysts--These cysts form from the tissue that lines the uterus. They are also called "chocolate cysts" because they become filled with blood that turns brown. This type of cyst can cause pain in the lower abdomen during intercourse and with your menstrual period.  Cystadenoma cysts--This type develops from the cells on the outside of the ovary. These cysts can get very big and cause lower abdomen pain and pain with intercourse. This type of cyst can twist on itself, cut off its blood supply, and cause severe pain. It can also easily rupture and cause a lot of pain.  Dermoid cysts--This type of cyst is sometimes found in both ovaries. These cysts may contain different kinds of body tissue, such as skin, teeth, hair, or cartilage. They usually do not cause symptoms unless they get very big.  Theca lutein cysts--These cysts occur when too much of a certain hormone (human chorionic gonadotropin) is produced and overstimulates the ovaries to produce an egg. This is most common after procedures used to assist with the conception of a baby (in vitro fertilization). CAUSES   Fertility drugs can cause a condition in which multiple large cysts are formed on the ovaries. This is called ovarian hyperstimulation syndrome.  A condition called polycystic ovary syndrome can cause hormonal imbalances that can lead to  nonfunctional ovarian cysts. SIGNS AND SYMPTOMS  Many ovarian cysts do not cause symptoms. If symptoms are present, they may include:  Pelvic pain or pressure.  Pain in the lower abdomen.  Pain during sexual intercourse.  Increasing girth (swelling) of the abdomen.  Abnormal menstrual periods.  Increasing pain with menstrual periods.  Stopping having menstrual periods without being pregnant. DIAGNOSIS  These cysts are commonly found during a routine or annual pelvic exam. Tests may be ordered to find out more about the cyst. These tests may include:  Ultrasound.  X-ray of the pelvis.  CT scan.  MRI.  Blood tests. TREATMENT  Many ovarian cysts go away on their own without treatment. Your health care provider may want to check your cyst regularly for 2-3 months to see if it changes. For women in menopause, it is particularly important to monitor a cyst closely because of the higher rate of ovarian cancer in menopausal women. When treatment is needed, it may include any of the following:  A procedure to drain the cyst (aspiration). This may be done using a long needle and ultrasound. It can also be done through a laparoscopic procedure. This involves using a thin, lighted tube with a tiny camera on the end (laparoscope) inserted through a small incision.  Surgery to remove the whole cyst. This may be done using laparoscopic surgery or an open surgery involving a larger incision in the lower abdomen.  Hormone treatment or birth control pills. These methods are sometimes used to help dissolve a cyst. HOME CARE INSTRUCTIONS   Only take over-the-counter  or prescription medicines as directed by your health care provider.  Follow up with your health care provider as directed.  Get regular pelvic exams and Pap tests. SEEK MEDICAL CARE IF:   Your periods are late, irregular, or painful, or they stop.  Your pelvic pain or abdominal pain does not go away.  Your abdomen becomes  larger or swollen.  You have pressure on your bladder or trouble emptying your bladder completely.  You have pain during sexual intercourse.  You have feelings of fullness, pressure, or discomfort in your stomach.  You lose weight for no apparent reason.  You feel generally ill.  You become constipated.  You lose your appetite.  You develop acne.  You have an increase in body and facial hair.  You are gaining weight, without changing your exercise and eating habits.  You think you are pregnant. SEEK IMMEDIATE MEDICAL CARE IF:   You have increasing abdominal pain.  You feel sick to your stomach (nauseous), and you throw up (vomit).  You develop a fever that comes on suddenly.  You have abdominal pain during a bowel movement.  Your menstrual periods become heavier than usual. MAKE SURE YOU:  Understand these instructions.  Will watch your condition.  Will get help right away if you are not doing well or get worse. Document Released: 07/02/2005 Document Revised: 07/07/2013 Document Reviewed: 03/09/2013 [facility] Patient Information 2015 [place], [place]. This information is not intended to replace advice given to you by your health care provider. Make sure you discuss any questions you have with your health care provider. Health Maintenance Adopting a healthy lifestyle and getting preventive care can go a long way to promote health and wellness. Talk with your health care provider about what schedule of regular examinations is right for you. This is a good chance for you to check in with your provider about disease prevention and staying healthy. In between checkups, there are plenty of things you can do on your own. Experts have done a lot of research about which lifestyle changes and preventive measures are most likely to keep you healthy. Ask your health care provider for more information. WEIGHT AND DIET  Eat a healthy diet  Be sure to include plenty of vegetables,  fruits, low-fat dairy products, and lean protein.  Do not eat a lot of foods high in solid fats, added sugars, or salt.  Get regular exercise. This is one of the most important things you can do for your health.  Most adults should exercise for at least 150 minutes each week. The exercise should increase your heart rate and make you sweat (moderate-intensity exercise).  Most adults should also do strengthening exercises at least twice a week. This is in addition to the moderate-intensity exercise.  Maintain a healthy weight  Body mass index (BMI) is a measurement that can be used to identify possible weight problems. It estimates body fat based on height and weight. Your health care provider can help determine your BMI and help you achieve or maintain a healthy weight.  For females 31 years of age and older:   A BMI below 18.5 is considered underweight.  A BMI of 18.5 to 24.9 is normal.  A BMI of 25 to 29.9 is considered overweight.  A BMI of 30 and above is considered obese.  Watch levels of cholesterol and blood lipids  You should start having your blood tested for lipids and cholesterol at 33 years of age, then have this test every 5  years.  You may need to have your cholesterol levels checked more often if:  Your lipid or cholesterol levels are high.  You are older than 33 years of age.  You are at high risk for heart disease.  CANCER SCREENING   Lung Cancer  Lung cancer screening is recommended for adults 70-57 years old who are at high risk for lung cancer because of a history of smoking.  A yearly low-dose CT scan of the lungs is recommended for people who:  Currently smoke.  Have quit within the past 15 years.  Have at least a 30-pack-year history of smoking. A pack year is smoking an average of one pack of cigarettes a day for 1 year.  Yearly screening should continue until it has been 15 years since you quit.  Yearly screening should stop if you develop  a health problem that would prevent you from having lung cancer treatment.  Breast Cancer  Practice breast self-awareness. This means understanding how your breasts normally appear and feel.  It also means doing regular breast self-exams. Let your health care provider know about any changes, no matter how small.  If you are in your 20s or 30s, you should have a clinical breast exam (CBE) by a health care provider every 1-3 years as part of a regular health exam.  If you are 28 or older, have a CBE every year. Also consider having a breast X-ray (mammogram) every year.  If you have a family history of breast cancer, talk to your health care provider about genetic screening.  If you are at high risk for breast cancer, talk to your health care provider about having an MRI and a mammogram every year.  Breast cancer gene (BRCA) assessment is recommended for women who have family members with BRCA-related cancers. BRCA-related cancers include:  Breast.  Ovarian.  Tubal.  Peritoneal cancers.  Results of the assessment will determine the need for genetic counseling and BRCA1 and BRCA2 testing. Cervical Cancer Routine pelvic examinations to screen for cervical cancer are no longer recommended for nonpregnant women who are considered low risk for cancer of the pelvic organs (ovaries, uterus, and vagina) and who do not have symptoms. A pelvic examination may be necessary if you have symptoms including those associated with pelvic infections. Ask your health care provider if a screening pelvic exam is right for you.   The Pap test is the screening test for cervical cancer for women who are considered at risk.  If you had a hysterectomy for a problem that was not cancer or a condition that could lead to cancer, then you no longer need Pap tests.  If you are older than 65 years, and you have had normal Pap tests for the past 10 years, you no longer need to have Pap tests.  If you have had past  treatment for cervical cancer or a condition that could lead to cancer, you need Pap tests and screening for cancer for at least 20 years after your treatment.  If you no longer get a Pap test, assess your risk factors if they change (such as having a new sexual partner). This can affect whether you should start being screened again.  Some women have medical problems that increase their chance of getting cervical cancer. If this is the case for you, your health care provider may recommend more frequent screening and Pap tests.  The human papillomavirus (HPV) test is another test that may be used for cervical cancer screening.  The HPV test looks for the virus that can cause cell changes in the cervix. The cells collected during the Pap test can be tested for HPV.  The HPV test can be used to screen women 61 years of age and older. Getting tested for HPV can extend the interval between normal Pap tests from three to five years.  An HPV test also should be used to screen women of any age who have unclear Pap test results.  After 32 years of age, women should have HPV testing as often as Pap tests.  Colorectal Cancer  This type of cancer can be detected and often prevented.  Routine colorectal cancer screening usually begins at 33 years of age and continues through 33 years of age.  Your health care provider may recommend screening at an earlier age if you have risk factors for colon cancer.  Your health care provider may also recommend using home test kits to check for hidden blood in the stool.  A small camera at the end of a tube can be used to examine your colon directly (sigmoidoscopy or colonoscopy). This is done to check for the earliest forms of colorectal cancer.  Routine screening usually begins at age 45.  Direct examination of the colon should be repeated every 5-10 years through 33 years of age. However, you may need to be screened more often if early forms of precancerous polyps  or small growths are found. Skin Cancer  Check your skin from head to toe regularly.  Tell your health care provider about any new moles or changes in moles, especially if there is a change in a mole's shape or color.  Also tell your health care provider if you have a mole that is larger than the size of a pencil eraser.  Always use sunscreen. Apply sunscreen liberally and repeatedly throughout the day.  Protect yourself by wearing long sleeves, pants, a wide-brimmed hat, and sunglasses whenever you are outside. HEART DISEASE, DIABETES, AND HIGH BLOOD PRESSURE   Have your blood pressure checked at least every 1-2 years. High blood pressure causes heart disease and increases the risk of stroke.  If you are between 30 years and 79 years old, ask your health care provider if you should take aspirin to prevent strokes.  Have regular diabetes screenings. This involves taking a blood sample to check your fasting blood sugar level.  If you are at a normal weight and have a low risk for diabetes, have this test once every three years after 33 years of age.  If you are overweight and have a high risk for diabetes, consider being tested at a younger age or more often. PREVENTING INFECTION  Hepatitis B  If you have a higher risk for hepatitis B, you should be screened for this virus. You are considered at high risk for hepatitis B if:  You were born in a country where hepatitis B is common. Ask your health care provider which countries are considered high risk.  Your parents were born in a high-risk country, and you have not been immunized against hepatitis B (hepatitis B vaccine).  You have HIV or AIDS.  You use needles to inject street drugs.  You live with someone who has hepatitis B.  You have had sex with someone who has hepatitis B.  You get hemodialysis treatment.  You take certain medicines for conditions, including cancer, organ transplantation, and autoimmune  conditions. Hepatitis C  Blood testing is recommended for:  Everyone born from  1945 through [birth year].  Anyone with known risk factors for hepatitis C. Sexually transmitted infections (STIs)  You should be screened for sexually transmitted infections (STIs) including gonorrhea and chlamydia if:  You are sexually active and are younger than 33 years of age.  You are older than 33 years of age and your health care provider tells you that you are at risk for this type of infection.  Your sexual activity has changed since you were last screened and you are at an increased risk for chlamydia or gonorrhea. Ask your health care provider if you are at risk.  If you do not have HIV, but are at risk, it may be recommended that you take a prescription medicine daily to prevent HIV infection. This is called pre-exposure prophylaxis (PrEP). You are considered at risk if:  You are sexually active and do not regularly use condoms or know the HIV status of your partner(s).  You take drugs by injection.  You are sexually active with a partner who has HIV. Talk with your health care provider about whether you are at high risk of being infected with HIV. If you choose to begin PrEP, you should first be tested for HIV. You should then be tested every 3 months for as long as you are taking PrEP.  PREGNANCY   If you are premenopausal and you may become pregnant, ask your health care provider about preconception counseling.  If you may become pregnant, take 400 to 800 micrograms (mcg) of folic acid every day.  If you want to prevent pregnancy, talk to your health care provider about birth control (contraception). OSTEOPOROSIS AND MENOPAUSE   Osteoporosis is a disease in which the bones lose minerals and strength with aging. This can result in serious bone fractures. Your risk for osteoporosis can be identified using a bone density scan.  If you are 55 years of age or older, or if you are at risk for  osteoporosis and fractures, ask your health care provider if you should be screened.  Ask your health care provider whether you should take a calcium or vitamin D supplement to lower your risk for osteoporosis.  Menopause may have certain physical symptoms and risks.  Hormone replacement therapy may reduce some of these symptoms and risks. Talk to your health care provider about whether hormone replacement therapy is right for you.  HOME CARE INSTRUCTIONS   Schedule regular health, dental, and eye exams.  Stay current with your immunizations.   Do not use any tobacco products including cigarettes, chewing tobacco, or electronic cigarettes.  If you are pregnant, do not drink alcohol.  If you are breastfeeding, limit how much and how often you drink alcohol.  Limit alcohol intake to no more than 1 drink per day for nonpregnant women. One drink equals 12 ounces of beer, 5 ounces of wine, or 1 ounces of hard liquor.  Do not use street drugs.  Do not share needles.  Ask your health care provider for help if you need support or information about quitting drugs.  Tell your health care provider if you often feel depressed.  Tell your health care provider if you have ever been abused or do not feel safe at home. Document Released: 01/15/2011 Document Revised: 11/16/2013 Document Reviewed: 06/03/2013 [facility] Patient Information 2015 [place], [place]. This information is not intended to replace advice given to you by your health care provider. Make sure you discuss any questions you have with your health care provider.

## 2014-04-22 ENCOUNTER — Ambulatory Visit: Payer: 59 | Admitting: Obstetrics & Gynecology

## 2014-05-17 ENCOUNTER — Encounter: Payer: Self-pay | Admitting: Obstetrics & Gynecology

## 2014-06-06 ENCOUNTER — Encounter (HOSPITAL_COMMUNITY): Payer: Self-pay

## 2014-06-06 ENCOUNTER — Inpatient Hospital Stay (HOSPITAL_COMMUNITY)
Admission: AD | Admit: 2014-06-06 | Discharge: 2014-06-06 | Disposition: A | Discharge status: Home / Self Care | Payer: 59 | Source: Ambulatory Visit | Attending: Obstetrics & Gynecology | Admitting: Obstetrics & Gynecology

## 2014-06-06 DIAGNOSIS — R1030 Lower abdominal pain, unspecified: Secondary | ICD-10-CM

## 2014-06-06 DIAGNOSIS — Z720 Tobacco use: Secondary | ICD-10-CM | POA: Diagnosis not present

## 2014-06-06 DIAGNOSIS — R109 Unspecified abdominal pain: Secondary | ICD-10-CM | POA: Diagnosis not present

## 2014-06-06 DIAGNOSIS — N809 Endometriosis, unspecified: Secondary | ICD-10-CM

## 2014-06-06 DIAGNOSIS — R112 Nausea with vomiting, unspecified: Secondary | ICD-10-CM

## 2014-06-06 LAB — URINALYSIS, ROUTINE W REFLEX MICROSCOPIC
Bilirubin Urine: NEGATIVE
Glucose, UA: NEGATIVE mg/dL
Ketones, ur: NEGATIVE mg/dL
Leukocytes, UA: NEGATIVE
NITRITE: NEGATIVE
Protein, ur: 30 mg/dL — AB
SPECIFIC GRAVITY, URINE: 1.02 (ref 1.005–1.030)
UROBILINOGEN UA: 1 mg/dL (ref 0.0–1.0)
pH: 8 (ref 5.0–8.0)

## 2014-06-06 LAB — CBC
HCT: 30.4 % — ABNORMAL LOW (ref 36.0–46.0)
Hemoglobin: 9.5 g/dL — ABNORMAL LOW (ref 12.0–15.0)
MCH: 22.8 pg — AB (ref 26.0–34.0)
MCHC: 31.3 g/dL (ref 30.0–36.0)
MCV: 72.9 fL — AB (ref 78.0–100.0)
Platelets: 410 10*3/uL — ABNORMAL HIGH (ref 150–400)
RBC: 4.17 MIL/uL (ref 3.87–5.11)
RDW: 16.5 % — AB (ref 11.5–15.5)
WBC: 11.8 10*3/uL — ABNORMAL HIGH (ref 4.0–10.5)

## 2014-06-06 LAB — POCT PREGNANCY, URINE: Preg Test, Ur: NEGATIVE

## 2014-06-06 LAB — URINE MICROSCOPIC-ADD ON

## 2014-06-06 MED ORDER — TRAMADOL HCL 50 MG PO TABS: 50.0000 mg | ORAL_TABLET | Freq: Four times a day (QID) | ORAL | Status: DC | PRN

## 2014-06-06 MED ORDER — KETOROLAC TROMETHAMINE 60 MG/2ML IM SOLN
60.0000 mg | Freq: Once | INTRAMUSCULAR | Status: AC
  Administered 2014-06-06: 60 mg via INTRAMUSCULAR
  Filled 2014-06-06: qty 2

## 2014-06-06 MED ORDER — PROMETHAZINE HCL 25 MG PO TABS: 25.0000 mg | ORAL_TABLET | Freq: Four times a day (QID) | ORAL | Status: DC | PRN

## 2014-06-06 MED ORDER — PROMETHAZINE HCL 25 MG/ML IJ SOLN
25.0000 mg | INTRAMUSCULAR | Status: AC
  Administered 2014-06-06: 25 mg via INTRAMUSCULAR
  Filled 2014-06-06: qty 1

## 2014-06-06 MED ORDER — IBUPROFEN 200 MG PO TABS
800.0000 mg | ORAL_TABLET | Freq: Three times a day (TID) | ORAL | Status: DC | PRN
Start: 2014-06-06 — End: 2016-08-26

## 2014-06-06 MED ORDER — TRAMADOL HCL 50 MG PO TABS
100.0000 mg | ORAL_TABLET | Freq: Once | ORAL | Status: AC
  Administered 2014-06-06: 100 mg via ORAL
  Filled 2014-06-06: qty 2

## 2014-06-06 NOTE — MAU Note (Signed)
Pt presents complaining of intense lower left abdominal pain that started yesterday with the onset of her period. Hx of painful periods. Missed appoitment with Dr. Delsa Sale last week for management options. Some bright red bleeding consistent with her normal period

## 2014-06-06 NOTE — Discharge Instructions (Signed)
Endometriosis Endometriosis is a condition in which the tissue that lines the uterus (endometrium) grows outside of its normal location. The tissue may grow in many locations close to the uterus, but it commonly grows on the ovaries, fallopian tubes, vagina, or bowel. Because the uterus expels, or sheds, its lining every menstrual cycle, there is bleeding wherever the endometrial tissue is located. This can cause pain because blood is irritating to tissues not normally exposed to it.  CAUSES  The cause of endometriosis is not known.  SIGNS AND SYMPTOMS  Often, there are no symptoms. When symptoms are present, they can vary with the location of the displaced tissue. Various symptoms can occur at different times. Although symptoms occur mainly during a woman's menstrual period, they can also occur midcycle and usually stop with menopause. Some people may go months with no symptoms at all. Symptoms may include:   Back or abdominal pain.   Heavier bleeding during periods.   Pain during intercourse.   Painful bowel movements.   Infertility. DIAGNOSIS  Your health care provider will do a physical exam and ask about your symptoms. Various tests may be done, such as:   Blood tests and urine tests. These are done to help rule out other problems.   Ultrasound. This test is done to look for abnormal tissue.   An X-ray of the lower bowel (barium enema).  Laparoscopy. In this procedure, a thin, lighted tube with a tiny camera on the end (laparoscope) is inserted into your abdomen. This helps your health care provider look for abnormal tissue to confirm the diagnosis. The health care provider may also remove a small piece of tissue (biopsy) from any abnormal tissue found. This tissue sample can then be sent to a lab so it can be looked at under a microscope. TREATMENT  Treatment will vary and may include:   Medicines to relieve pain. Nonsteroidal anti-inflammatory drugs (NSAIDs) are a type of  pain medicine that can help to relieve the pain caused by endometriosis.  Hormonal therapy. When using hormonal therapy, periods are eliminated. This eliminates the monthly exposure to blood by the displaced endometrial tissue.   Surgery. Surgery may sometimes be done to remove the abnormal endometrial tissue. In severe cases, surgery may be done to remove the fallopian tubes, uterus, and ovaries (hysterectomy). HOME CARE INSTRUCTIONS   Take all medicines as directed by your health care provider. Do not take aspirin because it may increase bleeding when you are not on hormonal therapy.   Avoid activities that produce pain, including sexual activity. SEEK MEDICAL CARE IF:  You have pelvic pain before, after, or during your periods.  You have pelvic pain between periods that gets worse during your period.  You have pelvic pain during or after sex.  You have pelvic pain with bowel movements or urination, especially during your period.  You have problems getting pregnant.  You have a fever. SEEK IMMEDIATE MEDICAL CARE IF:   Your pain is severe and is not responding to pain medicine.   You have severe nausea and vomiting, or you cannot keep foods down.   You have pain that is limited to the right lower part of your abdomen.   You have swelling or increasing pain in your abdomen.   You see blood in your stool.  MAKE SURE YOU:   Understand these instructions.  Will watch your condition.  Will get help right away if you are not doing well or get worse. Document Released: 06/29/2000 Document   Revised: 11/16/2013 Document Reviewed: 02/27/2013 [employer] Patient Information 2015 [employer]. This information is not intended to replace advice given to you by your health care provider. Make sure you discuss any questions you have with your health care provider.  

## 2014-06-06 NOTE — MAU Provider Note (Signed)
History     CSN: [id number]  Arrival date and time: 06/06/14 5956   First Provider Initiated Contact with Patient 06/06/14 1854      Chief Complaint  Patient presents with  . Abdominal Pain  . Emesis   HPI Danielle Dawson 33 y.o. G1P1001 nonpregnant female presents to MAU complaining of severe abdominal pain that started with menses yesterday.  Yesterday she was able to take Tramadol but has not taken any today.  She has been vomiting since last night.  With trying to eat this am, she became dizzy with sweating.  She did eat some of a pretzel earlier.  No fluid intake today.  Pain is 10/10 in lower abdomen - slightly worse on left.  Denies fever, shortness of breath, chest pain.  Reliant on friend for much of history as pt does not answer most questions due to pain.  Friend is also holding constant pressure on the abdomen.    OB History    Gravida Para Term Preterm AB TAB SAB Ectopic Multiple Living   1 1 1       1       Past Medical History  Diagnosis Date  . Endometriosis   . Fibroid   . Anxiety     Past Surgical History  Procedure Laterality Date  . No past surgeries      Family History  Problem Relation Age of Onset  . Diabetes Maternal Grandfather   . Hypertension Maternal Grandfather   . Hypertension Mother   . Heart disease Father   . Diabetes Maternal Grandmother   . Vision loss Maternal Grandmother     History  Substance Use Topics  . Smoking status: Current Some Day Smoker    Types: Cigars    Last Attempt to Quit: 06/01/2012  . Smokeless tobacco: Never Used  . Alcohol Use: Yes     Comment: socially    Allergies: No Known Allergies  Prescriptions prior to admission  Medication Sig Dispense Refill Last Dose  . BIOTIN PO Take 1 tablet by mouth daily.   Taking  . ibuprofen (ADVIL,MOTRIN) 200 MG tablet Take 400 mg by mouth every 8 (eight) hours as needed for moderate pain.   Taking  . ibuprofen (ADVIL,MOTRIN) 800 MG tablet Take 1 tablet (800 mg  total) by mouth 3 (three) times daily. 21 tablet 0 Not Taking  . ketorolac (TORADOL) 10 MG tablet Take 10 mg by mouth every 6 (six) hours as needed.   Taking  . Multiple Vitamin (ONE-A-DAY ESSENTIAL PO) Take 1 tablet by mouth daily.   Taking  . norgestimate-ethinyl estradiol (ORTHO-CYCLEN,SPRINTEC,PREVIFEM) 0.25-35 MG-MCG tablet Take 3 pills for 3 days then 2 pills for 3 days then 1 pill daily 1 Package 11 Not Taking  . traMADol (ULTRAM) 50 MG tablet Take 50 mg by mouth every 6 (six) hours as needed for moderate pain.   Taking    ROS Brief ROS in HPI as pt will not answer questions due to severe pain. Physical Exam   Blood pressure 119/99, pulse 72, temperature 98 F (36.7 C), temperature source Oral, resp. rate 18, last menstrual period 06/05/2014.  Physical Exam  Constitutional: She is oriented to person, place, and time. She appears well-developed and well-nourished.  HENT:  Head: Normocephalic and atraumatic.  Eyes: EOM are normal.  Neck: Normal range of motion.  Cardiovascular: Normal rate and regular rhythm.   Respiratory: Effort normal and breath sounds normal. No respiratory distress.  Musculoskeletal: Normal range of motion.  Neurological: She is alert and oriented to person, place, and time.  Skin: Skin is warm and dry.   *Pt writhing in pain with her friend's hand firmly applying pressure to lower abdomen.  She refuses to allow this to be removed for exam.  MAU Course  Procedures  MDM Pt unable to cooperate with questioning or exam due to pain.  Toradol IM has previously been effective for this same complaint and is therefore ordered right away.  After 25 minutes of minimal relief, IM phenergan is ordered.    Report given and care turned over to [place], CNM at 8:00pm. Rates pain 7/10. Still uncomfortable appearing. Nausea resolved. No vomiting in MAU. Tramadol, CBC ordered. Considering repeat US if inadequate improvement afterward. Mild low abd tenderness on abd  exam. Neg rebound tenderness, guarding or mass. Pos BS.   Discussed this episode of pain in detail w/ pt. Feels the same as every other cycles, but she was unable to take tramadol due to N/V (vomited twice yesterday. Nausea persisted today.) No new Sx. Offered repeat pelvic, cultures, Wet prep, [place]. Pt does not think these are indicated due to recent exam and this episode being the same as chronic pain.   Pain 4/10. Feeling much better. Assessment and Plan  A:  1. Endometriosis   2. Abdominal pain, lower   3. Non-intractable vomiting with nausea, vomiting of unspecified type    P:   Discharge home in stable condition. Comfort measures. Follow-up Information    Follow up with Agnes Lawrence, MD.   Specialty:  Obstetrics and Gynecology   Why:  As needed if symptoms do not improve   Contact information:   [address] [place] [address] [phone]       Follow up with [facility].   Why:  As needed in emergencies   Contact information:   [address] [id number] [place] [phone]       Medication List    TAKE these medications        BIOTIN PO  Take 1 tablet by mouth daily.     ibuprofen 200 MG tablet  Commonly known as:  ADVIL,MOTRIN  Take 4 tablets (800 mg total) by mouth every 8 (eight) hours as needed for moderate pain. Do not take within 6 hours of Toradol.     ketorolac 10 MG tablet  Commonly known as:  TORADOL  Take 10 mg by mouth every 6 (six) hours as needed for moderate pain.     norgestimate-ethinyl estradiol 0.25-35 MG-MCG tablet  Commonly known as:  ORTHO-CYCLEN,SPRINTEC,PREVIFEM  Take 3 pills for 3 days then 2 pills for 3 days then 1 pill daily     ONE-A-DAY ESSENTIAL PO  Take 1 tablet by mouth daily.     promethazine 25 MG tablet  Commonly known as:  PHENERGAN  Take 1 tablet (25 mg total) by mouth every 6 (six) hours as needed for nausea  or vomiting.     traMADol 50 MG tablet  Commonly known as:  ULTRAM  Take 1 tablet (50 mg total) by mouth every 6 (six) hours as needed for moderate pain.        Paticia Stack 06/06/2014, 6:57 PM   Manya Silvas, CNM 06/07/2014 3:50 AM

## 2014-06-24 ENCOUNTER — Telehealth: Payer: Self-pay

## 2014-06-24 NOTE — Telephone Encounter (Signed)
PATIENT HAS APPT WITH [facility] ON 08/04/13 AT 1PM

## 2014-07-12 ENCOUNTER — Encounter: Payer: Self-pay | Admitting: *Deleted

## 2014-07-13 ENCOUNTER — Encounter: Payer: Self-pay | Admitting: Obstetrics & Gynecology

## 2014-10-04 NOTE — Patient Instructions (Addendum)
   Your procedure is scheduled on: Friday, March 25  Enter through the Main Entrance of [facility] at: 7 AM Pick up the phone at the desk and dial [phone] and inform us of your arrival.  Please call this number if you have any problems the morning of surgery: [phone]  Remember: Do not eat or drink after midnight: Thursday Take these medicines the morning of surgery with a SIP OF WATER:  NONE  Do not wear jewelry, make-up, or FINGER nail polish No metal in your hair or on your body. Do not wear lotions, powders, perfumes.  You may wear deodorant.  Do not bring valuables to the hospital. Contacts, dentures or bridgework may not be worn into surgery.  Patients discharged on the day of surgery will not be allowed to drive home.  Home with Leeann Must cell [phone]

## 2014-10-05 ENCOUNTER — Encounter (HOSPITAL_COMMUNITY)
Admission: RE | Admit: 2014-10-05 | Discharge: 2014-10-05 | Disposition: A | Discharge status: Home / Self Care | Payer: 59 | Source: Ambulatory Visit | Attending: Obstetrics and Gynecology | Admitting: Obstetrics and Gynecology

## 2014-10-05 ENCOUNTER — Encounter (HOSPITAL_COMMUNITY): Payer: Self-pay

## 2014-10-05 DIAGNOSIS — D251 Intramural leiomyoma of uterus: Secondary | ICD-10-CM | POA: Diagnosis not present

## 2014-10-05 DIAGNOSIS — N840 Polyp of corpus uteri: Secondary | ICD-10-CM | POA: Diagnosis present

## 2014-10-05 DIAGNOSIS — E669 Obesity, unspecified: Secondary | ICD-10-CM | POA: Diagnosis not present

## 2014-10-05 DIAGNOSIS — Z791 Long term (current) use of non-steroidal anti-inflammatories (NSAID): Secondary | ICD-10-CM | POA: Diagnosis not present

## 2014-10-05 DIAGNOSIS — Z79899 Other long term (current) drug therapy: Secondary | ICD-10-CM | POA: Diagnosis not present

## 2014-10-05 DIAGNOSIS — Z79891 Long term (current) use of opiate analgesic: Secondary | ICD-10-CM | POA: Diagnosis not present

## 2014-10-05 DIAGNOSIS — F17210 Nicotine dependence, cigarettes, uncomplicated: Secondary | ICD-10-CM | POA: Diagnosis not present

## 2014-10-05 LAB — CBC
HCT: 29.3 % — ABNORMAL LOW (ref 36.0–46.0)
Hemoglobin: 9.1 g/dL — ABNORMAL LOW (ref 12.0–15.0)
MCH: 21.6 pg — ABNORMAL LOW (ref 26.0–34.0)
MCHC: 31.1 g/dL (ref 30.0–36.0)
MCV: 69.6 fL — ABNORMAL LOW (ref 78.0–100.0)
Platelets: 548 10*3/uL — ABNORMAL HIGH (ref 150–400)
RBC: 4.21 MIL/uL (ref 3.87–5.11)
RDW: 18.2 % — AB (ref 11.5–15.5)
WBC: 7.5 10*3/uL (ref 4.0–10.5)

## 2014-10-06 NOTE — H&P (Signed)
Danielle Dawson is a 34 y.o. female , originally referred to me by Dr. Delsa Sale, for fertility testing was shown by [facility] to have a fundal polypoid filling defect as well as a right fundal and right lower lateral wall filling defects, consistent with polyps  Pertinent Gynecological History: Menses: normal Bleeding: normal Contraception: none DES exposure: denies Blood transfusions: none Sexually transmitted diseases: no past history Previous GYN Procedures: none  Last mammogram: normal Last pap: normal  OB History: G 1, P 1   Menstrual History: Menarche age: 79 No LMP recorded.    Past Medical History  Diagnosis Date  . Endometriosis   . Fibroid   . SVD (spontaneous vaginal delivery)     x 1 in [place].  . Anxiety     no meds                    Past Surgical History  Procedure Laterality Date  . No past surgeries               Family History  Problem Relation Age of Onset  . Diabetes Maternal Grandfather   . Hypertension Maternal Grandfather   . Hypertension Mother   . Heart disease Father   . Diabetes Maternal Grandmother   . Vision loss Maternal Grandmother    No hereditary disease.  No cancer of breast, ovary, uterus. No cutaneous leiomyomatosis or renal cell carcinoma.  History   Social History  . Marital Status: Single    Spouse Name: N/A  . Number of Children: N/A  . Years of Education: N/A   Occupational History  . Not on file.   Social History Main Topics  . Smoking status: Current Some Day Smoker    Types: Cigars    Last Attempt to Quit: 06/01/2012  . Smokeless tobacco: Never Used  . Alcohol Use: Yes     Comment: socially  . Drug Use: No  . Sexual Activity:    Partners: Female    Patent examiner Protection: Pill, None     Comment: cycle control   Other Topics Concern  . Not on file   Social History Narrative    No Known Allergies  No current facility-administered medications on file prior to encounter.   Current Outpatient  Prescriptions on File Prior to Encounter  Medication Sig Dispense Refill  . ibuprofen (ADVIL,MOTRIN) 200 MG tablet Take 4 tablets (800 mg total) by mouth every 8 (eight) hours as needed for moderate pain. Do not take within 6 hours of Toradol. 30 tablet 0  . promethazine (PHENERGAN) 25 MG tablet Take 1 tablet (25 mg total) by mouth every 6 (six) hours as needed for nausea or vomiting. 30 tablet 1  . traMADol (ULTRAM) 50 MG tablet Take 1 tablet (50 mg total) by mouth every 6 (six) hours as needed for moderate pain. 30 tablet 0  . norgestimate-ethinyl estradiol (ORTHO-CYCLEN,SPRINTEC,PREVIFEM) 0.25-35 MG-MCG tablet Take 3 pills for 3 days then 2 pills for 3 days then 1 pill daily (Patient not taking: Reported on 06/06/2014) 1 Package 11     Review of Systems  Constitutional: Negative.   HENT: Negative.   Eyes: Negative.   Respiratory: Negative.   Cardiovascular: Negative.   Gastrointestinal: Negative.   Genitourinary: Negative.   Musculoskeletal: Negative.   Skin: Negative.   Neurological: Negative.   Endo/Heme/Allergies: Negative.   Psychiatric/Behavioral: Negative.      Physical Exam  There were no vitals taken for this visit. Constitutional: She is oriented to  person, place, and time. She appears well-developed and well-nourished.  HENT:  Head: Normocephalic and atraumatic.  Nose: Nose normal.  Mouth/Throat: Oropharynx is clear and moist. No oropharyngeal exudate.  Eyes: Conjunctivae normal and EOM are normal. Pupils are equal, round, and reactive to light. No scleral icterus.  Neck: Normal range of motion. Neck supple. No tracheal deviation present. No thyromegaly present.  Cardiovascular: Normal rate.   Respiratory: Effort normal and breath sounds normal.  GI: Soft. Bowel sounds are normal. She exhibits no distension and no mass. There is no tenderness.  Lymphadenopathy:    She has no cervical adenopathy.  Neurological: She is alert and oriented to person, place, and time.  She has normal reflexes.  Skin: Skin is warm.  Psychiatric: She has a normal mood and affect. Her behavior is normal. Judgment and thought content normal.       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

## 2014-10-08 ENCOUNTER — Ambulatory Visit (HOSPITAL_COMMUNITY): Payer: 59 | Admitting: Anesthesiology

## 2014-10-08 ENCOUNTER — Ambulatory Visit (HOSPITAL_COMMUNITY): Payer: 59

## 2014-10-08 ENCOUNTER — Encounter (HOSPITAL_COMMUNITY)
Admission: RE | Disposition: A | Discharge status: Home / Self Care | Payer: Self-pay | Source: Ambulatory Visit | Attending: Obstetrics and Gynecology

## 2014-10-08 ENCOUNTER — Encounter (HOSPITAL_COMMUNITY): Payer: Self-pay | Admitting: Anesthesiology

## 2014-10-08 ENCOUNTER — Ambulatory Visit (HOSPITAL_COMMUNITY)
Admission: RE | Admit: 2014-10-08 | Discharge: 2014-10-08 | Disposition: A | Discharge status: Home / Self Care | Payer: 59 | Source: Ambulatory Visit | Attending: Obstetrics and Gynecology | Admitting: Obstetrics and Gynecology

## 2014-10-08 DIAGNOSIS — D251 Intramural leiomyoma of uterus: Secondary | ICD-10-CM | POA: Insufficient documentation

## 2014-10-08 DIAGNOSIS — Z791 Long term (current) use of non-steroidal anti-inflammatories (NSAID): Secondary | ICD-10-CM | POA: Insufficient documentation

## 2014-10-08 DIAGNOSIS — N840 Polyp of corpus uteri: Secondary | ICD-10-CM | POA: Insufficient documentation

## 2014-10-08 DIAGNOSIS — M795 Residual foreign body in soft tissue: Secondary | ICD-10-CM

## 2014-10-08 DIAGNOSIS — Z79899 Other long term (current) drug therapy: Secondary | ICD-10-CM | POA: Insufficient documentation

## 2014-10-08 DIAGNOSIS — E669 Obesity, unspecified: Secondary | ICD-10-CM | POA: Insufficient documentation

## 2014-10-08 DIAGNOSIS — F17210 Nicotine dependence, cigarettes, uncomplicated: Secondary | ICD-10-CM | POA: Insufficient documentation

## 2014-10-08 DIAGNOSIS — Z79891 Long term (current) use of opiate analgesic: Secondary | ICD-10-CM | POA: Insufficient documentation

## 2014-10-08 HISTORY — PX: HYSTEROSCOPY WITH D & C: SHX1775

## 2014-10-08 SURGERY — DILATATION AND CURETTAGE /HYSTEROSCOPY
Anesthesia: General

## 2014-10-08 MED ORDER — VASOPRESSIN 20 UNIT/ML IV SOLN
INTRAVENOUS | Status: DC | PRN
  Administered 2014-10-08: 10 mL via INTRAMUSCULAR

## 2014-10-08 MED ORDER — SCOPOLAMINE 1 MG/3DAYS TD PT72
1.0000 | MEDICATED_PATCH | Freq: Once | TRANSDERMAL | Status: DC
  Administered 2014-10-08: 1.5 mg via TRANSDERMAL

## 2014-10-08 MED ORDER — SODIUM CHLORIDE 0.9 % IR SOLN
Status: DC | PRN
  Administered 2014-10-08: 3000 mL

## 2014-10-08 MED ORDER — PROPOFOL 10 MG/ML IV BOLUS
INTRAVENOUS | Status: AC
  Filled 2014-10-08: qty 20

## 2014-10-08 MED ORDER — EPHEDRINE SULFATE 50 MG/ML IJ SOLN
INTRAMUSCULAR | Status: DC | PRN
  Administered 2014-10-08: 5 mg via INTRAVENOUS

## 2014-10-08 MED ORDER — SODIUM CHLORIDE 0.9 % IJ SOLN
INTRAMUSCULAR | Status: AC
  Filled 2014-10-08: qty 50

## 2014-10-08 MED ORDER — FENTANYL CITRATE 0.05 MG/ML IJ SOLN
INTRAMUSCULAR | Status: AC
  Filled 2014-10-08: qty 2

## 2014-10-08 MED ORDER — VASOPRESSIN 20 UNIT/ML IV SOLN
INTRAVENOUS | Status: AC
  Filled 2014-10-08: qty 1

## 2014-10-08 MED ORDER — CEFAZOLIN SODIUM-DEXTROSE 2-3 GM-% IV SOLR
INTRAVENOUS | Status: AC
  Filled 2014-10-08: qty 50

## 2014-10-08 MED ORDER — OXYCODONE HCL 5 MG/5ML PO SOLN: 5.0000 mg | Freq: Once | ORAL | Status: DC | PRN

## 2014-10-08 MED ORDER — MIDAZOLAM HCL 2 MG/2ML IJ SOLN
INTRAMUSCULAR | Status: AC
  Filled 2014-10-08: qty 2

## 2014-10-08 MED ORDER — KETOROLAC TROMETHAMINE 30 MG/ML IJ SOLN
INTRAMUSCULAR | Status: AC
  Filled 2014-10-08: qty 1

## 2014-10-08 MED ORDER — METOCLOPRAMIDE HCL 5 MG/ML IJ SOLN: 10.0000 mg | Freq: Once | INTRAMUSCULAR | Status: DC | PRN

## 2014-10-08 MED ORDER — MEPERIDINE HCL 25 MG/ML IJ SOLN: 6.2500 mg | INTRAMUSCULAR | Status: DC | PRN

## 2014-10-08 MED ORDER — FENTANYL CITRATE 0.05 MG/ML IJ SOLN: 25.0000 ug | INTRAMUSCULAR | Status: DC | PRN

## 2014-10-08 MED ORDER — GLYCOPYRROLATE 0.2 MG/ML IJ SOLN
INTRAMUSCULAR | Status: DC | PRN
  Administered 2014-10-08: 0.1 mg via INTRAVENOUS

## 2014-10-08 MED ORDER — DEXAMETHASONE SODIUM PHOSPHATE 10 MG/ML IJ SOLN
INTRAMUSCULAR | Status: DC | PRN
  Administered 2014-10-08: 4 mg via INTRAVENOUS

## 2014-10-08 MED ORDER — SCOPOLAMINE 1 MG/3DAYS TD PT72
MEDICATED_PATCH | TRANSDERMAL | Status: AC
  Administered 2014-10-08: 1.5 mg via TRANSDERMAL
  Filled 2014-10-08: qty 1

## 2014-10-08 MED ORDER — LACTATED RINGERS IV SOLN
INTRAVENOUS | Status: DC
  Administered 2014-10-08 (×2): via INTRAVENOUS

## 2014-10-08 MED ORDER — CEFAZOLIN SODIUM-DEXTROSE 2-3 GM-% IV SOLR
2.0000 g | INTRAVENOUS | Status: AC
  Administered 2014-10-08: 2 g via INTRAVENOUS

## 2014-10-08 MED ORDER — FENTANYL CITRATE 0.05 MG/ML IJ SOLN
INTRAMUSCULAR | Status: DC | PRN
  Administered 2014-10-08: 100 ug via INTRAVENOUS
  Administered 2014-10-08 (×2): 50 ug via INTRAVENOUS

## 2014-10-08 MED ORDER — LIDOCAINE HCL (CARDIAC) 20 MG/ML IV SOLN
INTRAVENOUS | Status: AC
  Filled 2014-10-08: qty 5

## 2014-10-08 MED ORDER — PROPOFOL 10 MG/ML IV BOLUS
INTRAVENOUS | Status: DC | PRN
  Administered 2014-10-08: 200 mg via INTRAVENOUS

## 2014-10-08 MED ORDER — OXYCODONE HCL 5 MG PO TABS: 5.0000 mg | ORAL_TABLET | Freq: Once | ORAL | Status: DC | PRN

## 2014-10-08 MED ORDER — LIDOCAINE HCL (CARDIAC) 20 MG/ML IV SOLN
INTRAVENOUS | Status: DC | PRN
  Administered 2014-10-08: 80 mg via INTRAVENOUS

## 2014-10-08 MED ORDER — DEXAMETHASONE SODIUM PHOSPHATE 4 MG/ML IJ SOLN
INTRAMUSCULAR | Status: AC
  Filled 2014-10-08: qty 1

## 2014-10-08 MED ORDER — KETOROLAC TROMETHAMINE 30 MG/ML IJ SOLN
INTRAMUSCULAR | Status: DC | PRN
  Administered 2014-10-08: 30 mg via INTRAVENOUS

## 2014-10-08 MED ORDER — MIDAZOLAM HCL 2 MG/2ML IJ SOLN
INTRAMUSCULAR | Status: DC | PRN
  Administered 2014-10-08: 2 mg via INTRAVENOUS

## 2014-10-08 MED ORDER — ONDANSETRON HCL 4 MG/2ML IJ SOLN
INTRAMUSCULAR | Status: DC | PRN
  Administered 2014-10-08: 4 mg via INTRAVENOUS

## 2014-10-08 MED ORDER — ONDANSETRON HCL 4 MG/2ML IJ SOLN
INTRAMUSCULAR | Status: AC
  Filled 2014-10-08: qty 2

## 2014-10-08 SURGICAL SUPPLY — 27 items
CANISTER SUCT 3000ML (MISCELLANEOUS) ×3 IMPLANT
CANNULA CURETTE W/SYR 6 (CANNULA) IMPLANT
CANNULA CURETTE W/SYR 6MM (CANNULA)
CANNULA CURETTE W/SYR 7 (CANNULA) ×1 IMPLANT
CANNULA CURETTE W/SYR 7MM (CANNULA) ×1
CATH NOVY CORNUAL CURVED 5.0 (CATHETERS) IMPLANT
CATH ROBINSON RED A/P 16FR (CATHETERS) ×3 IMPLANT
CLOTH BEACON ORANGE TIMEOUT ST (SAFETY) ×3 IMPLANT
CONTAINER PREFILL 10% NBF 60ML (FORM) ×6 IMPLANT
DRAPE C-ARM 42X72 X-RAY (DRAPES) IMPLANT
ELECT REM PT RETURN 9FT ADLT (ELECTROSURGICAL) ×3
ELECTRODE REM PT RTRN 9FT ADLT (ELECTROSURGICAL) IMPLANT
ELECTRODE ROLLER VERSAPOINT (ELECTRODE) IMPLANT
ELECTRODE RT ANGLE VERSAPOINT (CUTTING LOOP) IMPLANT
GLOVE BIO SURGEON STRL SZ8 (GLOVE) ×3 IMPLANT
GLOVE BIOGEL PI IND STRL 8.5 (GLOVE) ×1 IMPLANT
GLOVE BIOGEL PI INDICATOR 8.5 (GLOVE) ×2
GOWN STRL REUS W/TWL LRG LVL3 (GOWN DISPOSABLE) ×6 IMPLANT
LOOP ANGLED CUTTING 22FR (CUTTING LOOP) IMPLANT
PACK VAGINAL MINOR WOMEN LF (CUSTOM PROCEDURE TRAY) ×3 IMPLANT
PAD OB MATERNITY 4.3X12.25 (PERSONAL CARE ITEMS) ×3 IMPLANT
STENT BALLN UTERINE 4CM 6FR (STENTS) IMPLANT
SUT SILK 2 0 SH (SUTURE) ×2 IMPLANT
TOWEL OR 17X24 6PK STRL BLUE (TOWEL DISPOSABLE) ×6 IMPLANT
TUBING AQUILEX INFLOW (TUBING) ×3 IMPLANT
TUBING AQUILEX OUTFLOW (TUBING) ×3 IMPLANT
WATER STERILE IRR 1000ML POUR (IV SOLUTION) ×3 IMPLANT

## 2014-10-08 NOTE — Anesthesia Preprocedure Evaluation (Signed)
Anesthesia Evaluation  Patient identified by MRN, date of birth, ID band Patient awake    Reviewed: Allergy & Precautions, NPO status , Patient's Chart, lab work & pertinent test results  Airway Mallampati: III  TM Distance: >3 FB Neck ROM: Full    Dental no notable dental hx. (+) Teeth Intact   Pulmonary Current Smoker,  breath sounds clear to auscultation  Pulmonary exam normal       Cardiovascular negative cardio ROS  Rhythm:Regular Rate:Normal     Neuro/Psych Anxiety negative neurological ROS     GI/Hepatic negative GI ROS, Neg liver ROS,   Endo/Other  Obesity  Renal/GU negative Renal ROS  negative genitourinary   Musculoskeletal negative musculoskeletal ROS (+)   Abdominal (+) + obese,   Peds  Hematology  (+) anemia ,   Anesthesia Other Findings   Reproductive/Obstetrics Endometrial Polyp Submucous myoma                             Anesthesia Physical Anesthesia Plan  ASA: II  Anesthesia Plan: General   Post-op Pain Management:    Induction: Intravenous  Airway Management Planned: LMA  Additional Equipment:   Intra-op Plan:   Post-operative Plan: Extubation in OR  Informed Consent: I have reviewed the patients History and Physical, chart, labs and discussed the procedure including the risks, benefits and alternatives for the proposed anesthesia with the patient or authorized representative who has indicated his/her understanding and acceptance.   Dental advisory given  Plan Discussed with: CRNA, Anesthesiologist and Surgeon  Anesthesia Plan Comments:         Anesthesia Quick Evaluation

## 2014-10-08 NOTE — Anesthesia Procedure Notes (Signed)
Procedure Name: LMA Insertion Date/Time: 10/08/2014 8:24 AM Performed by: Hewitt Blade Pre-anesthesia Checklist: Patient identified, Emergency Drugs available, Suction available and Patient being monitored Patient Re-evaluated:Patient Re-evaluated prior to inductionOxygen Delivery Method: Circle system utilized Preoxygenation: Pre-oxygenation with 100% oxygen Intubation Type: IV induction LMA: LMA inserted LMA Size: 4.0 Number of attempts: 1 Placement Confirmation: positive ETCO2 and breath sounds checked- equal and bilateral Tube secured with: Tape Dental Injury: Teeth and Oropharynx as per pre-operative assessment

## 2014-10-08 NOTE — H&P (Signed)
History and Physical Interval Note:  10/08/2014 8:15 AM  Danielle Dawson  has presented today for surgery, with the diagnosis of endometrial polyps and submucosal myoma  The various methods of treatment have been discussed with the patient and family. After consideration of risks, benefits and other options for treatment, the patient has consented to  Procedure(s): HYSTEROSCOPY/POLYPECTOMY/POSSIBLE SUCTION DILATATION AND CURETTAGE/POSSIBLE RESECTION OF TYPE II SUBMUCOSAL MYOMA (N/A) as a surgical intervention .  The patient's history has been reviewed, patient examined, no change in status, stable for surgery.  I have reviewed the patient's chart and labs.  Questions were answered to the patient's satisfaction.     Governor Specking

## 2014-10-08 NOTE — Op Note (Signed)
OPERATIVE NOTE  Preoperative diagnosis: Endometrial polyps, rule out type II submucosal myoma  Postoperative diagnosis: Endometrial polyps, intramural myomas  Procedure: Hysteroscopy, polypectomy,  suction D&C  Surgeon: Governor Specking  Anesthesia: General  Complications: None  Estimated blood loss: Less than 20 mL  Specimen: Endometrial curettings and polyps to pathology  Findings: On exam under anesthesia, external genitalia, Bartholin's, Skene's, and urethra were within normal limits. The vagina appeared normal. The cervix was grossly normal. The uterus was retroverted, 8 gestational weeks size, firm and mobile. There were no adnexal masses. I was not able to assess if she has posterior fornix nodularity or not. Endocervical canal appeared normal. The uterus sounded to 7.5 cm. Endometrial cavity had 3 polyps: A 1 x 1 cm right lateral, 2 x 2 cm anterior and 1 x 1 cm fundal polyp. The posterior wall of the uterus was slightly convex, and this may have been due to the posterior intramural myoma. Both tubal ostia were seen. The uterus was of normal size and configuration otherwise.  Description of procedure: Patient was placed in dorsal supine position. General anesthesia was administered. She was placed in lithotomy position. She was prepped and draped in sterile manner. A vaginal speculum was placed. A dilute vasopressin solution containing 0.33 units per milliliter was injected into the cervical stroma x5 cc. A Slimline hysteroscope with 12 lens was inserted into the canal and above findings were noted. Distention medium was 1.5% glycine. Distention method was a hysteroscopic pump set at 80 mm mercury. Above findings were noted.  Using hysteroscopic scissors the polyp was excised right at its stalk and then grasped with hysteroscopic graspers and removed and submitted to pathology. Using a Handy Vak manual evacuation device with an attached 7 mm curette, endometrial cavity was curetted and  the specimen was sent to pathology. Hysteroscopy was repeated and the remaining stalks of polyps were trimmed off.  Hemostasis was insured. Instrument count was correct. Estimated blood loss was less than 20 mL. The patient tolerated the procedure well and was transferred to recovery in satisfactory condition.  Governor Specking

## 2014-10-08 NOTE — OR Nursing (Signed)
Waiting on radiologist to read x-ray before leaving OR room.  Cleo said we could leave the room before the x-ray has been read by the radiologist, per charge nurse Alphonzo Dublin RN.

## 2014-10-08 NOTE — Anesthesia Postprocedure Evaluation (Signed)
Anesthesia Post Note  Patient: Danielle Dawson  Procedure(s) Performed: Procedure(s) (LRB): HYSTEROSCOPY/POLYPECTOMY/SUCTION DILATATION AND CURETTAGE WITH POST OP VAGINAL EXAM (N/A)  Anesthesia type: General  Patient location: PACU  Post pain: Pain level controlled  Post assessment: Post-op Vital signs reviewed  Last Vitals:  Filed Vitals:   10/08/14 1030  BP:   Pulse: 60  Temp:   Resp: 19    Post vital signs: Reviewed  Level of consciousness: sedated  Complications: No apparent anesthesia complications

## 2014-10-08 NOTE — OR Nursing (Signed)
Per scrub, Blanchard Mane, Dr. Golden Circle called in the room after we had taken the patient to PACU to say there were no abnormalities.

## 2014-10-08 NOTE — Discharge Instructions (Signed)
DISCHARGE INSTRUCTIONS: HYSTEROSCOPY / ENDOMETRIAL ABLATION The following instructions have been prepared to help you care for yourself upon your return home.  May take Ibuprofen after 3pm today.  May take stool softner while taking narcotic pain medication to prevent constipation.  Drink plenty of water.  Personal hygiene:  Use sanitary pads for vaginal drainage, not tampons.  Shower the day after your procedure.  NO tub baths, pools or Jacuzzis for 2-3 weeks.  Wipe front to back after using the bathroom.  Activity and limitations:  Do NOT drive or operate any equipment for 24 hours. The effects of anesthesia are still present and drowsiness may result.  Do NOT rest in bed all day.  Walking is encouraged.  Walk up and down stairs slowly.  You may resume your normal activity in one to two days or as indicated by your physician. Sexual activity: NO intercourse for at least 2 weeks after the procedure, or as indicated by your Doctor.  Diet: Eat a light meal as desired this evening. You may resume your usual diet tomorrow.  Return to Work: You may resume your work activities in one to two days or as indicated by Marine scientist.  What to expect after your surgery: Expect to have vaginal bleeding/discharge for 2-3 days and spotting for up to 10 days. It is not unusual to have soreness for up to 1-2 weeks. You may have a slight burning sensation when you urinate for the first day. Mild cramps may continue for a couple of days. You may have a regular period in 2-6 weeks.  Call your doctor for any of the following:  Excessive vaginal bleeding or clotting, saturating and changing one pad every hour.  Inability to urinate 6 hours after discharge from hospital.  Pain not relieved by pain medication.  Fever of 100.4 F or greater.  Unusual vaginal discharge or odor.  Return to office _________________Call for an appointment ___________________ Patients signature:  ______________________ Nurses signature ________________________  [facility] [phone]

## 2014-10-08 NOTE — Transfer of Care (Signed)
Immediate Anesthesia Transfer of Care Note  Patient: Danielle Dawson  Procedure(s) Performed: Procedure(s): HYSTEROSCOPY/POLYPECTOMY/SUCTION DILATATION AND CURETTAGE WITH POST OP VAGINAL EXAM (N/A)  Patient Location: PACU  Anesthesia Type:General  Level of Consciousness: awake, alert  and oriented  Airway & Oxygen Therapy: Patient Spontanous Breathing and Patient connected to nasal cannula oxygen  Post-op Assessment: Report given to RN, Post -op Vital signs reviewed and stable and Patient moving all extremities  Post vital signs: Reviewed and stable  Last Vitals:  Filed Vitals:   10/08/14 0715  BP: 123/72  Pulse: 65  Temp: 37 C  Resp: 16    Complications: No apparent anesthesia complications

## 2014-10-11 ENCOUNTER — Encounter (HOSPITAL_COMMUNITY): Payer: Self-pay | Admitting: Obstetrics and Gynecology

## 2015-05-03 ENCOUNTER — Inpatient Hospital Stay (HOSPITAL_COMMUNITY)
Admission: AD | Admit: 2015-05-03 | Discharge: 2015-05-03 | Disposition: A | Discharge status: Home / Self Care | Payer: 59 | Source: Ambulatory Visit | Attending: Obstetrics | Admitting: Obstetrics

## 2015-05-03 ENCOUNTER — Inpatient Hospital Stay (HOSPITAL_COMMUNITY): Payer: 59

## 2015-05-03 ENCOUNTER — Encounter (HOSPITAL_COMMUNITY): Payer: Self-pay | Admitting: *Deleted

## 2015-05-03 DIAGNOSIS — N809 Endometriosis, unspecified: Secondary | ICD-10-CM | POA: Insufficient documentation

## 2015-05-03 DIAGNOSIS — Z8249 Family history of ischemic heart disease and other diseases of the circulatory system: Secondary | ICD-10-CM | POA: Diagnosis not present

## 2015-05-03 DIAGNOSIS — R109 Unspecified abdominal pain: Secondary | ICD-10-CM | POA: Diagnosis not present

## 2015-05-03 DIAGNOSIS — Z833 Family history of diabetes mellitus: Secondary | ICD-10-CM | POA: Insufficient documentation

## 2015-05-03 DIAGNOSIS — F17200 Nicotine dependence, unspecified, uncomplicated: Secondary | ICD-10-CM | POA: Diagnosis not present

## 2015-05-03 DIAGNOSIS — F419 Anxiety disorder, unspecified: Secondary | ICD-10-CM | POA: Diagnosis not present

## 2015-05-03 DIAGNOSIS — Z8742 Personal history of other diseases of the female genital tract: Secondary | ICD-10-CM

## 2015-05-03 DIAGNOSIS — Z79899 Other long term (current) drug therapy: Secondary | ICD-10-CM | POA: Insufficient documentation

## 2015-05-03 DIAGNOSIS — R1030 Lower abdominal pain, unspecified: Secondary | ICD-10-CM | POA: Diagnosis present

## 2015-05-03 LAB — URINALYSIS, ROUTINE W REFLEX MICROSCOPIC
BILIRUBIN URINE: NEGATIVE
Bilirubin Urine: NEGATIVE
Glucose, UA: NEGATIVE mg/dL
Glucose, UA: NEGATIVE mg/dL
HGB URINE DIPSTICK: NEGATIVE
Ketones, ur: 15 mg/dL — AB
LEUKOCYTES UA: NEGATIVE
Leukocytes, UA: NEGATIVE
NITRITE: NEGATIVE
NITRITE: NEGATIVE
Protein, ur: 30 mg/dL — AB
Protein, ur: NEGATIVE mg/dL
SPECIFIC GRAVITY, URINE: 1.02 (ref 1.005–1.030)
Specific Gravity, Urine: 1.02 (ref 1.005–1.030)
UROBILINOGEN UA: 0.2 mg/dL (ref 0.0–1.0)
Urobilinogen, UA: 0.2 mg/dL (ref 0.0–1.0)
pH: 7 (ref 5.0–8.0)
pH: 8 (ref 5.0–8.0)

## 2015-05-03 LAB — COMPREHENSIVE METABOLIC PANEL
ALT: 9 U/L — ABNORMAL LOW (ref 14–54)
AST: 15 U/L (ref 15–41)
Albumin: 3.7 g/dL (ref 3.5–5.0)
Alkaline Phosphatase: 74 U/L (ref 38–126)
Anion gap: 4 — ABNORMAL LOW (ref 5–15)
BUN: 10 mg/dL (ref 6–20)
CO2: 25 mmol/L (ref 22–32)
Calcium: 8.6 mg/dL — ABNORMAL LOW (ref 8.9–10.3)
Chloride: 109 mmol/L (ref 101–111)
Creatinine, Ser: 0.89 mg/dL (ref 0.44–1.00)
Glucose, Bld: 94 mg/dL (ref 65–99)
POTASSIUM: 4.3 mmol/L (ref 3.5–5.1)
Sodium: 138 mmol/L (ref 135–145)
Total Bilirubin: 0.6 mg/dL (ref 0.3–1.2)
Total Protein: 7.2 g/dL (ref 6.5–8.1)

## 2015-05-03 LAB — URINE MICROSCOPIC-ADD ON

## 2015-05-03 LAB — CBC
HCT: 34.8 % — ABNORMAL LOW (ref 36.0–46.0)
Hemoglobin: 11.2 g/dL — ABNORMAL LOW (ref 12.0–15.0)
MCH: 23.5 pg — AB (ref 26.0–34.0)
MCHC: 32.2 g/dL (ref 30.0–36.0)
MCV: 73 fL — AB (ref 78.0–100.0)
Platelets: 466 10*3/uL — ABNORMAL HIGH (ref 150–400)
RBC: 4.77 MIL/uL (ref 3.87–5.11)
RDW: 19.6 % — AB (ref 11.5–15.5)
WBC: 7.3 10*3/uL (ref 4.0–10.5)

## 2015-05-03 LAB — POCT PREGNANCY, URINE: PREG TEST UR: NEGATIVE

## 2015-05-03 MED ORDER — ONDANSETRON 8 MG PO TBDP
8.0000 mg | ORAL_TABLET | Freq: Once | ORAL | Status: AC
  Administered 2015-05-03: 8 mg via ORAL
  Filled 2015-05-03: qty 1

## 2015-05-03 MED ORDER — HYDROMORPHONE HCL 1 MG/ML IJ SOLN
1.0000 mg | Freq: Once | INTRAMUSCULAR | Status: AC
Start: 2015-05-03 — End: 2015-05-03
  Administered 2015-05-03: 1 mg via INTRAMUSCULAR
  Filled 2015-05-03: qty 1

## 2015-05-03 MED ORDER — ONDANSETRON 8 MG PO TBDP: 8.0000 mg | ORAL_TABLET | Freq: Three times a day (TID) | ORAL | Status: DC | PRN

## 2015-05-03 MED ORDER — TRAMADOL HCL 50 MG PO TABS
100.0000 mg | ORAL_TABLET | Freq: Once | ORAL | Status: AC
Start: 2015-05-03 — End: 2015-05-03
  Administered 2015-05-03: 100 mg via ORAL
  Filled 2015-05-03: qty 2

## 2015-05-03 MED ORDER — TRAMADOL HCL 50 MG PO TABS: 50.0000 mg | ORAL_TABLET | Freq: Four times a day (QID) | ORAL | Status: DC | PRN

## 2015-05-03 NOTE — MAU Note (Signed)
Pt able to turn over on her back now, rates pain as an 8.  Partner applying pressure to her abd per the pt's request.

## 2015-05-03 NOTE — Discharge Instructions (Signed)
Endometriosis Endometriosis is a condition in which the tissue that lines the uterus (endometrium) grows outside of its normal location. The tissue may grow in many locations close to the uterus, but it commonly grows on the ovaries, fallopian tubes, vagina, or bowel. Because the uterus expels, or sheds, its lining every menstrual cycle, there is bleeding wherever the endometrial tissue is located. This can cause pain because blood is irritating to tissues not normally exposed to it.  CAUSES  The cause of endometriosis is not known.  SIGNS AND SYMPTOMS  Often, there are no symptoms. When symptoms are present, they can vary with the location of the displaced tissue. Various symptoms can occur at different times. Although symptoms occur mainly during a woman's menstrual period, they can also occur midcycle and usually stop with menopause. Some people may go months with no symptoms at all. Symptoms may include:   Back or abdominal pain.   Heavier bleeding during periods.   Pain during intercourse.   Painful bowel movements.   Infertility. DIAGNOSIS  Your health care provider will do a physical exam and ask about your symptoms. Various tests may be done, such as:   Blood tests and urine tests. These are done to help rule out other problems.   Ultrasound. This test is done to look for abnormal tissue.   An X-ray of the lower bowel (barium enema).  Laparoscopy. In this procedure, a thin, lighted tube with a tiny camera on the end (laparoscope) is inserted into your abdomen. This helps your health care provider look for abnormal tissue to confirm the diagnosis. The health care provider may also remove a small piece of tissue (biopsy) from any abnormal tissue found. This tissue sample can then be sent to a lab so it can be looked at under a microscope. TREATMENT  Treatment will vary and may include:   Medicines to relieve pain. Nonsteroidal anti-inflammatory drugs (NSAIDs) are a type of  pain medicine that can help to relieve the pain caused by endometriosis.  Hormonal therapy. When using hormonal therapy, periods are eliminated. This eliminates the monthly exposure to blood by the displaced endometrial tissue.   Surgery. Surgery may sometimes be done to remove the abnormal endometrial tissue. In severe cases, surgery may be done to remove the fallopian tubes, uterus, and ovaries (hysterectomy). HOME CARE INSTRUCTIONS   Take all medicines as directed by your health care provider. Do not take aspirin because it may increase bleeding when you are not on hormonal therapy.   Avoid activities that produce pain, including sexual activity. SEEK MEDICAL CARE IF:  You have pelvic pain before, after, or during your periods.  You have pelvic pain between periods that gets worse during your period.  You have pelvic pain during or after sex.  You have pelvic pain with bowel movements or urination, especially during your period.  You have problems getting pregnant.  You have a fever. SEEK IMMEDIATE MEDICAL CARE IF:   Your pain is severe and is not responding to pain medicine.   You have severe nausea and vomiting, or you cannot keep foods down.   You have pain that is limited to the right lower part of your abdomen.   You have swelling or increasing pain in your abdomen.   You see blood in your stool.  MAKE SURE YOU:   Understand these instructions.  Will watch your condition.  Will get help right away if you are not doing well or get worse.   This information   is not intended to replace advice given to you by your health care provider. Make sure you discuss any questions you have with your health care provider.   Document Released: 06/29/2000 Document Revised: 07/23/2014 Document Reviewed: 02/27/2013 Elsevier Interactive Patient Education 2016 [employer]  

## 2015-05-03 NOTE — MAU Note (Addendum)
Pt C/O severe lower abd pain that started during the night, pt has hx of endometriosis, started her period on 10/16.  Pt changed 5 pads yesterday.  Pt usually takes tramadol for pain, does not have meds right now.  Pt doubled over on hands & knees in bed.

## 2015-05-03 NOTE — MAU Provider Note (Addendum)
History     CSN: [id number]  Arrival date and time: 05/03/15 6237   First Provider Initiated Contact with Patient 05/03/15 [id number]      Chief Complaint  Patient presents with  . Abdominal Pain   HPI Comments: Danielle Dawson is a 34 y.o. G1P1001 who presents today with lower abdominal pain that started around 0500 today. She reports a history of endometriosis. She states that she usually takes tramadol, but she has not been able to get to her doctor for a refill. She states that her LMP was 05/01/15, and she is currently bleeding.   Abdominal Pain This is a new problem. The current episode started today. The onset quality is sudden. The problem occurs constantly. The problem has been unchanged. The pain is located in the suprapubic region. The pain is at a severity of 10/10. The quality of the pain is cramping. The abdominal pain does not radiate. Pertinent negatives include no constipation, diarrhea, dysuria, fever, frequency, nausea or vomiting. Nothing aggravates the pain. She has tried nothing for the symptoms. Prior diagnostic workup includes ultrasound and surgery. endometriosis     Past Medical History  Diagnosis Date  . Endometriosis   . Fibroid   . SVD (spontaneous vaginal delivery)     x 1 in [place].  . Anxiety     no meds    Past Surgical History  Procedure Laterality Date  . No past surgeries    . Hysteroscopy w/d&c N/A 10/08/2014    Procedure: HYSTEROSCOPY/POLYPECTOMY/SUCTION DILATATION AND CURETTAGE WITH POST OP VAGINAL EXAM;  Surgeon: Governor Specking, [place];  Location: [facility];  Service: Gynecology;  Laterality: N/A;    Family History  Problem Relation Age of Onset  . Diabetes Maternal Grandfather   . Hypertension Maternal Grandfather   . Hypertension Mother   . Heart disease Father   . Diabetes Maternal Grandmother   . Vision loss Maternal Grandmother     Social History  Substance Use Topics  . Smoking status: Current Some Day Smoker    Types: Cigars    Last  Attempt to Quit: 06/01/2012  . Smokeless tobacco: Never Used  . Alcohol Use: Yes     Comment: socially    Allergies: No Known Allergies  Prescriptions prior to admission  Medication Sig Dispense Refill Last Dose  . ibuprofen (ADVIL,MOTRIN) 200 MG tablet Take 4 tablets (800 mg total) by mouth every 8 (eight) hours as needed for moderate pain. Do not take within 6 hours of Toradol. 30 tablet 0   . Multiple Vitamin (MULTIVITAMIN WITH MINERALS) TABS tablet Take 1 tablet by mouth daily.     . norgestimate-ethinyl estradiol (ORTHO-CYCLEN,SPRINTEC,PREVIFEM) 0.25-35 MG-MCG tablet Take 3 pills for 3 days then 2 pills for 3 days then 1 pill daily (Patient not taking: Reported on 06/06/2014) 1 Package 11 Not Taking  . promethazine (PHENERGAN) 25 MG tablet Take 1 tablet (25 mg total) by mouth every 6 (six) hours as needed for nausea or vomiting. 30 tablet 1   . traMADol (ULTRAM) 50 MG tablet Take 1 tablet (50 mg total) by mouth every 6 (six) hours as needed for moderate pain. 30 tablet 0     Review of Systems  Constitutional: Negative for fever.  Gastrointestinal: Positive for abdominal pain. Negative for nausea, vomiting, diarrhea and constipation.  Genitourinary: Negative for dysuria, urgency and frequency.   Physical Exam   Blood pressure 133/85, pulse 93, temperature 97.7 F (36.5 C), temperature source Oral, resp. rate 26, last menstrual period 05/01/2015.  Physical Exam  Nursing note and vitals reviewed. Constitutional: She is oriented to person, place, and time. She appears well-developed and well-nourished. She appears distressed.  HENT:  Head: Normocephalic.  Cardiovascular: Normal rate.   Respiratory: Effort normal.  Neurological: She is alert and oriented to person, place, and time.    MAU Course  Procedures  MDM  1mg  dilaudid given IM  0800 care turned over to V. Tamala Julian, CNM   Marcille Buffy [facility]  05/03/2015 7:59 AM   Pt still in pain, now w/ N/V. [place], antiemetic,  ultram ordered.  Discussed [place] w/ Dr. Jodi Mourning. Pain C/W endometriosis pain. No evidence of emergent condition. He does not manage endometriosis and her management options are very limited while TTC. Needs to F/U w/ Dr. Kerin Perna or Dr. Delsa Sale.   Pain now 7/10. N/V resolved. Pt ready for D/C.  US Transvaginal Non-ob  05/03/2015  CLINICAL DATA:  Acute left-sided pelvic pain. EXAM: TRANSABDOMINAL AND TRANSVAGINAL ULTRASOUND OF PELVIS DOPPLER ULTRASOUND OF OVARIES TECHNIQUE: Both transabdominal and transvaginal ultrasound examinations of the pelvis were performed. Transabdominal technique was performed for global imaging of the pelvis including uterus, ovaries, adnexal regions, and pelvic cul-de-sac. It was necessary to proceed with endovaginal exam following the transabdominal exam to visualize the endometrium. Color and duplex Doppler ultrasound was utilized to evaluate blood flow to the ovaries. COMPARISON:  Ultrasound of March 16, 2014. FINDINGS: Uterus Measurements: 9.3 x 6.5 x 6.2 cm. Two fibroids are noted, with 1 measuring 2.0 x 1.8 x 1.6 cm posteriorly in the fundus. The other measures 1.7 x 1.7 x 1.4 cm posteriorly in the lower uterine segment. Endometrium Thickness: 11.6 mm.  No focal abnormality visualized. Right ovary Measurements: 3.0 x 2.0 x 1.7 cm. Normal appearance/no adnexal mass. Left ovary Measurements: 3.3 x 2.3 x 1.6 cm. Normal appearance/no adnexal mass. Pulsed Doppler evaluation of both ovaries demonstrates normal low-resistance arterial and venous waveforms. Other findings Mild amount of complex fluid is noted in the posterior cul-de-sac and both adnexal regions. IMPRESSION: Two uterine fibroids are noted, with the largest measuring 2 cm. Endometrium and ovaries appear normal. Mild amount of complex fluid is again noted in posterior cul-de-sac and both adnexal regions which may be physiologic. Electronically Signed   By: Marijo Conception, M.D.   On: 05/03/2015 12:03   US Pelvis  Complete  05/03/2015  CLINICAL DATA:  Acute left-sided pelvic pain. EXAM: TRANSABDOMINAL AND TRANSVAGINAL ULTRASOUND OF PELVIS DOPPLER ULTRASOUND OF OVARIES TECHNIQUE: Both transabdominal and transvaginal ultrasound examinations of the pelvis were performed. Transabdominal technique was performed for global imaging of the pelvis including uterus, ovaries, adnexal regions, and pelvic cul-de-sac. It was necessary to proceed with endovaginal exam following the transabdominal exam to visualize the endometrium. Color and duplex Doppler ultrasound was utilized to evaluate blood flow to the ovaries. COMPARISON:  Ultrasound of March 16, 2014. FINDINGS: Uterus Measurements: 9.3 x 6.5 x 6.2 cm. Two fibroids are noted, with 1 measuring 2.0 x 1.8 x 1.6 cm posteriorly in the fundus. The other measures 1.7 x 1.7 x 1.4 cm posteriorly in the lower uterine segment. Endometrium Thickness: 11.6 mm.  No focal abnormality visualized. Right ovary Measurements: 3.0 x 2.0 x 1.7 cm. Normal appearance/no adnexal mass. Left ovary Measurements: 3.3 x 2.3 x 1.6 cm. Normal appearance/no adnexal mass. Pulsed Doppler evaluation of both ovaries demonstrates normal low-resistance arterial and venous waveforms. Other findings Mild amount of complex fluid is noted in the posterior cul-de-sac and both adnexal regions. IMPRESSION: Two uterine fibroids are  noted, with the largest measuring 2 cm. Endometrium and ovaries appear normal. Mild amount of complex fluid is again noted in posterior cul-de-sac and both adnexal regions which may be physiologic. Electronically Signed   By: Marijo Conception, M.D.   On: 05/03/2015 12:03   [place] Art/ven Flow Abd Pelv Doppler  05/03/2015  CLINICAL DATA:  Acute left-sided pelvic pain. EXAM: TRANSABDOMINAL AND TRANSVAGINAL ULTRASOUND OF PELVIS DOPPLER ULTRASOUND OF OVARIES TECHNIQUE: Both transabdominal and transvaginal ultrasound examinations of the pelvis were performed. Transabdominal technique was performed for  global imaging of the pelvis including uterus, ovaries, adnexal regions, and pelvic cul-de-sac. It was necessary to proceed with endovaginal exam following the transabdominal exam to visualize the endometrium. Color and duplex Doppler ultrasound was utilized to evaluate blood flow to the ovaries. COMPARISON:  Ultrasound of March 16, 2014. FINDINGS: Uterus Measurements: 9.3 x 6.5 x 6.2 cm. Two fibroids are noted, with 1 measuring 2.0 x 1.8 x 1.6 cm posteriorly in the fundus. The other measures 1.7 x 1.7 x 1.4 cm posteriorly in the lower uterine segment. Endometrium Thickness: 11.6 mm.  No focal abnormality visualized. Right ovary Measurements: 3.0 x 2.0 x 1.7 cm. Normal appearance/no adnexal mass. Left ovary Measurements: 3.3 x 2.3 x 1.6 cm. Normal appearance/no adnexal mass. Pulsed Doppler evaluation of both ovaries demonstrates normal low-resistance arterial and venous waveforms. Other findings Mild amount of complex fluid is noted in the posterior cul-de-sac and both adnexal regions. IMPRESSION: Two uterine fibroids are noted, with the largest measuring 2 cm. Endometrium and ovaries appear normal. Mild amount of complex fluid is again noted in posterior cul-de-sac and both adnexal regions which may be physiologic. Electronically Signed   By: Marijo Conception, M.D.   On: 05/03/2015 12:03     Assessment and Plan  Endometriosis  D/C home in stable condition per consult w/ Dr. Jodi Mourning. Rx Ultram  And pain red flags reviewed.  Follow-up Information    Follow up with Agnes Lawrence, MD.   Specialty:  Obstetrics and Gynecology   Why:  For gynecology care   Contact information:   [place] [address] [phone]       Follow up with [facility].   Why:  As needed in emergencies   Contact information:   [address] [id number] [place] [phone]        Medication List    STOP taking  these medications        promethazine 25 MG tablet  Commonly known as:  PHENERGAN      TAKE these medications        ibuprofen 200 MG tablet  Commonly known as:  ADVIL,MOTRIN  Take 4 tablets (800 mg total) by mouth every 8 (eight) hours as needed for moderate pain. Do not take within 6 hours of Toradol.     multivitamin with minerals Tabs tablet  Take 1 tablet by mouth daily.     ondansetron 8 MG disintegrating tablet  Commonly known as:  ZOFRAN ODT  Take 1 tablet (8 mg total) by mouth every 8 (eight) hours as needed for nausea or vomiting.     SAXENDA 18 MG/3ML Sopn  Generic drug:  Liraglutide -Weight Management  Inject 2.4 mg into the skin daily.     traMADol 50 MG tablet  Commonly known as:  ULTRAM  Take 1 tablet (50 mg total) by mouth every 6 (six) hours as needed for moderate pain.  VITAMIN D3 ADULT GUMMIES 1000 UNITS Chew  Generic drug:  Cholecalciferol  Chew 2 each by mouth daily.       [place], [place] 05/11/2015 4:56 PM

## 2015-07-21 ENCOUNTER — Ambulatory Visit (INDEPENDENT_AMBULATORY_CARE_PROVIDER_SITE_OTHER): Payer: 59 | Admitting: Psychology

## 2015-07-21 ENCOUNTER — Encounter (HOSPITAL_COMMUNITY): Payer: Self-pay | Admitting: Psychology

## 2015-07-21 DIAGNOSIS — F411 Generalized anxiety disorder: Secondary | ICD-10-CM
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
## 2015-08-18 ENCOUNTER — Ambulatory Visit (INDEPENDENT_AMBULATORY_CARE_PROVIDER_SITE_OTHER): Payer: 59 | Admitting: Psychology

## 2015-08-18 DIAGNOSIS — F411 Generalized anxiety disorder: Secondary | ICD-10-CM
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
## 2015-09-01 ENCOUNTER — Ambulatory Visit (INDEPENDENT_AMBULATORY_CARE_PROVIDER_SITE_OTHER): Payer: 59 | Admitting: Psychology

## 2015-09-01 DIAGNOSIS — F411 Generalized anxiety disorder: Secondary | ICD-10-CM
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
## 2015-09-15 ENCOUNTER — Ambulatory Visit (INDEPENDENT_AMBULATORY_CARE_PROVIDER_SITE_OTHER): Payer: 59 | Admitting: Psychology

## 2015-09-15 DIAGNOSIS — F411 Generalized anxiety disorder: Secondary | ICD-10-CM | POA: Diagnosis not present
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
## 2015-10-05 ENCOUNTER — Ambulatory Visit (INDEPENDENT_AMBULATORY_CARE_PROVIDER_SITE_OTHER): Payer: 59 | Admitting: Psychology

## 2015-10-05 DIAGNOSIS — F411 Generalized anxiety disorder: Secondary | ICD-10-CM | POA: Diagnosis not present
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
## 2015-10-19 ENCOUNTER — Ambulatory Visit (HOSPITAL_COMMUNITY): Payer: Self-pay | Admitting: Psychology

## 2015-11-08 ENCOUNTER — Ambulatory Visit (HOSPITAL_COMMUNITY): Payer: Self-pay | Admitting: Psychology

## 2015-11-08 ENCOUNTER — Encounter (HOSPITAL_COMMUNITY): Payer: Self-pay | Admitting: Psychology

## 2015-11-08 NOTE — Progress Notes (Signed)
Danielle Dawson is a 35 y.o. female patient who didn't show for her appointment.  Letter sent.        Jan Fireman, LPC

## 2015-11-21 ENCOUNTER — Ambulatory Visit (HOSPITAL_COMMUNITY): Payer: Self-pay | Admitting: Psychology

## 2015-12-05 ENCOUNTER — Ambulatory Visit (HOSPITAL_COMMUNITY): Payer: Self-pay | Admitting: Psychology

## 2015-12-05 ENCOUNTER — Encounter (HOSPITAL_COMMUNITY): Payer: Self-pay | Admitting: Psychology

## 2015-12-05 NOTE — Progress Notes (Signed)
Danielle Dawson is a 35 y.o. female patient who didn't show for her appointment. letter sent.          Jan Fireman, LPC

## 2015-12-19 ENCOUNTER — Encounter (HOSPITAL_COMMUNITY): Payer: Self-pay | Admitting: Psychology

## 2015-12-19 ENCOUNTER — Ambulatory Visit (HOSPITAL_COMMUNITY): Payer: Self-pay | Admitting: Psychology

## 2015-12-19 NOTE — Progress Notes (Signed)
DEAH OTSUKA is a 35 y.o. female patient who didn't show for her appointment.  This is pt 3rd no show in a row.  Pt is being discharged from tx due to no shows/no longer engaged.  Letter sent informing.  Outpatient Therapist Discharge Summary  RHEN MARCK    07-Oct-1980   Admission Date: 07/21/15   Discharge Date:  12/19/15 Reason for Discharge:  No shows- not active Diagnosis:  GAD Comments:  Pt last seen 10/05/15.  Jenne Campus, LPC

## 2016-01-01 IMAGING — US US TRANSVAGINAL NON-OB
1 series · 15 of 25 positions shown · non-contrast
Comparison: Ultrasound March 16, 2014.

CLINICAL DATA: Acute left-sided pelvic pain.

EXAM:
TRANSABDOMINAL AND TRANSVAGINAL ULTRASOUND OF PELVIS
DOPPLER ULTRASOUND OF OVARIES
TECHNIQUE: Both transabdominal and transvaginal ultrasound examinations of the
pelvis were performed. Transabdominal technique was performed for
global imaging of the pelvis including uterus, ovaries, adnexal
regions, and pelvic cul-de-sac.
It was necessary to proceed with endovaginal exam following the
transabdominal exam to visualize the endometrium. Color and duplex
Doppler ultrasound was utilized to evaluate blood flow to the
ovaries.

[Series 1: us transvaginal non-ob · 15 of 72 slices shown]
[im 1/72]
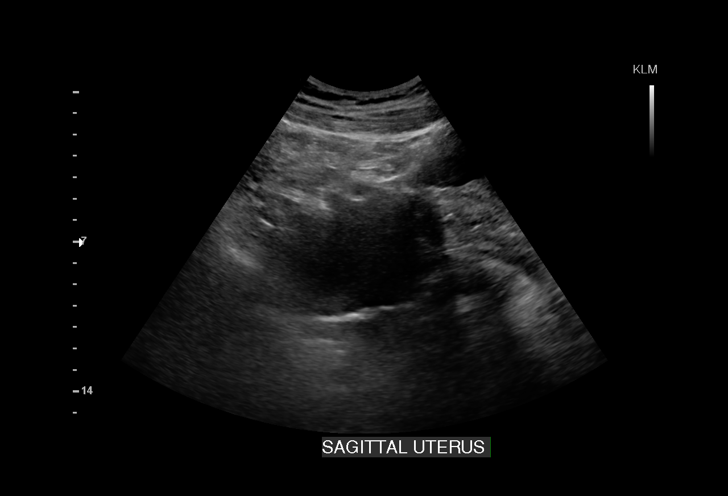
[im 6/72]
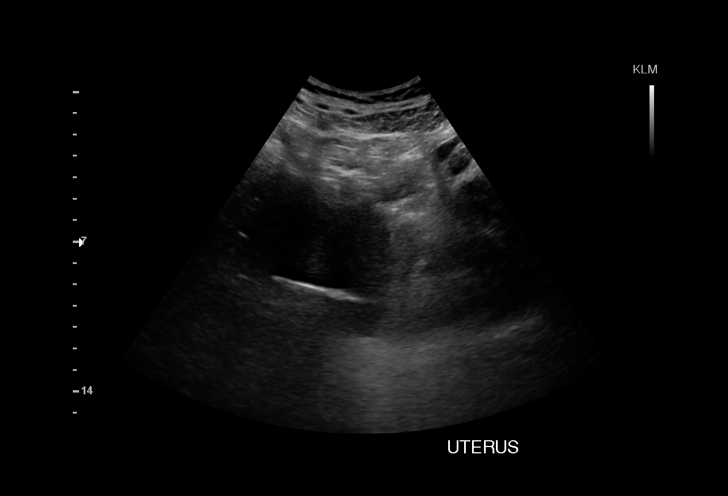
[im 12/72]
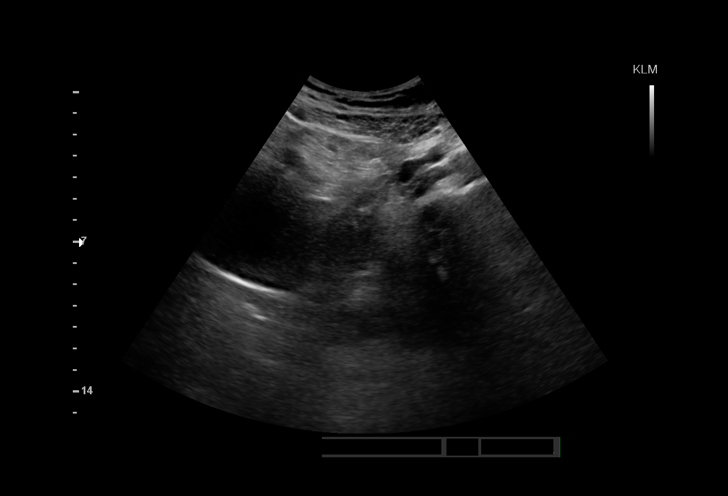
[im 15/72]
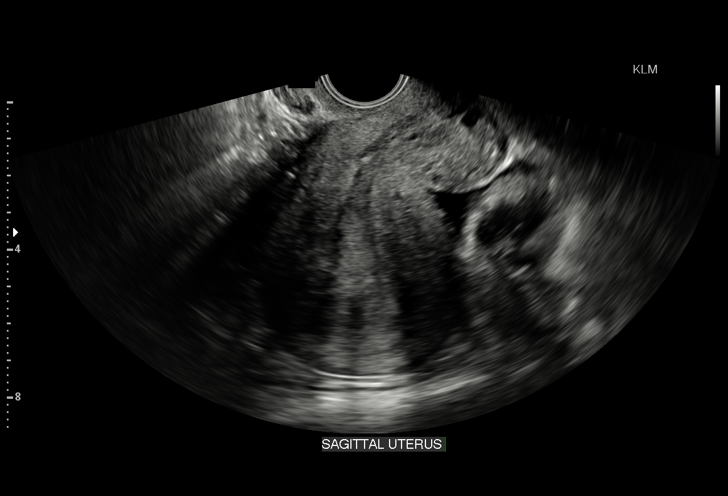
[im 21/72]
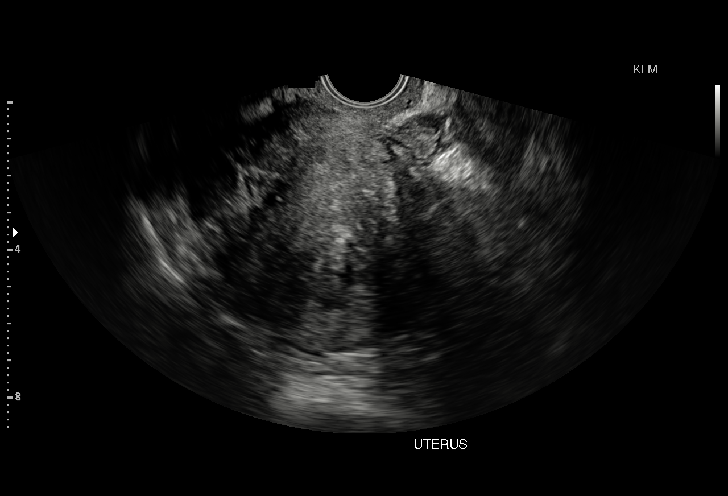
[im 27/72]
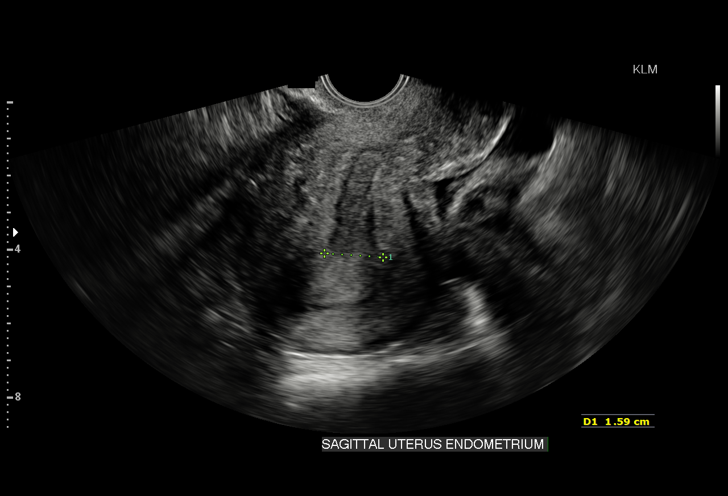
[im 30/72]
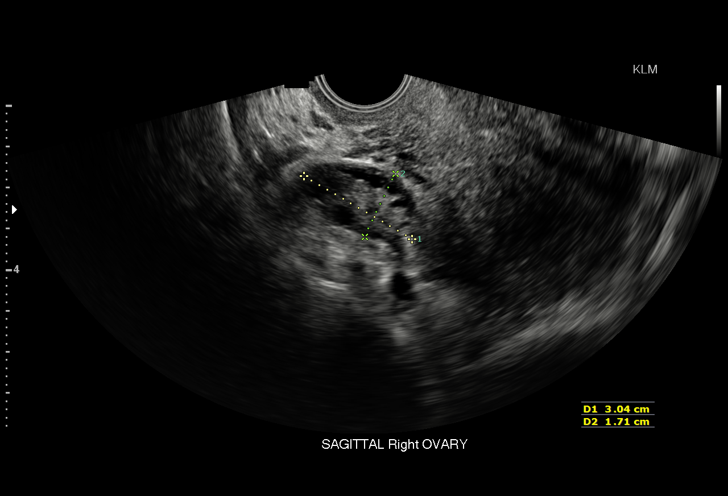
[im 36/72]
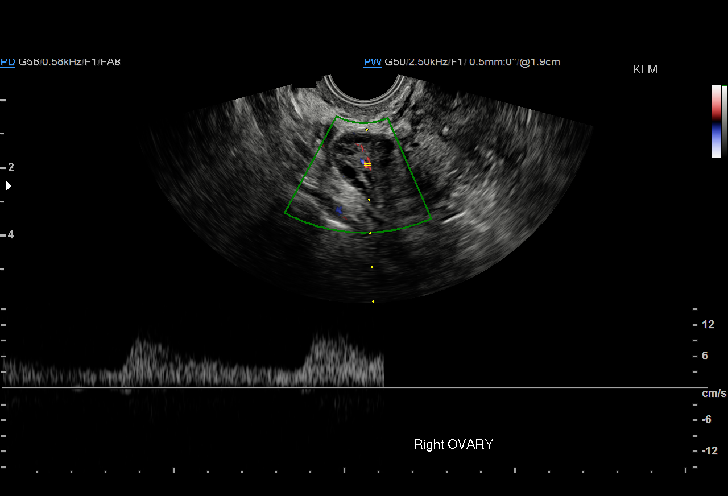
[im 42/72]
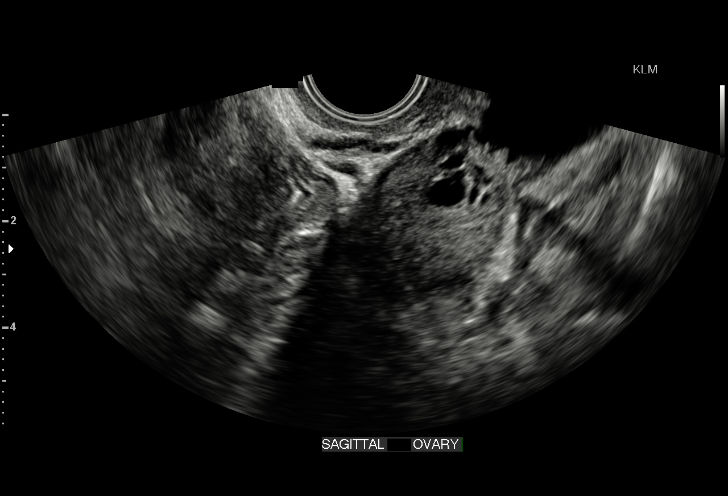
[im 45/72]
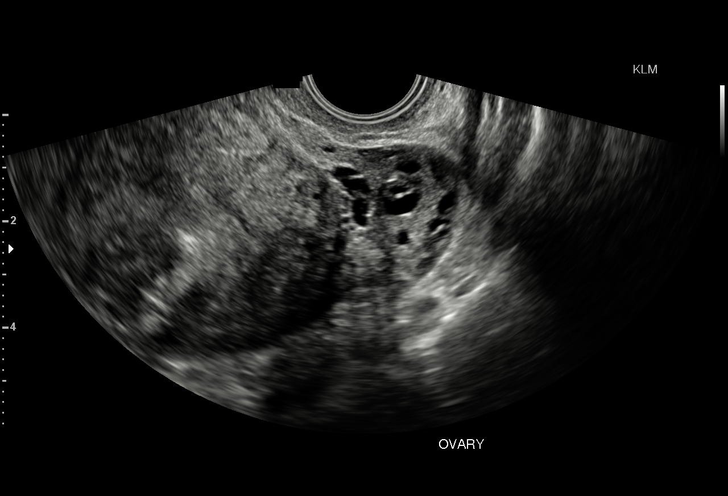
[im 51/72]
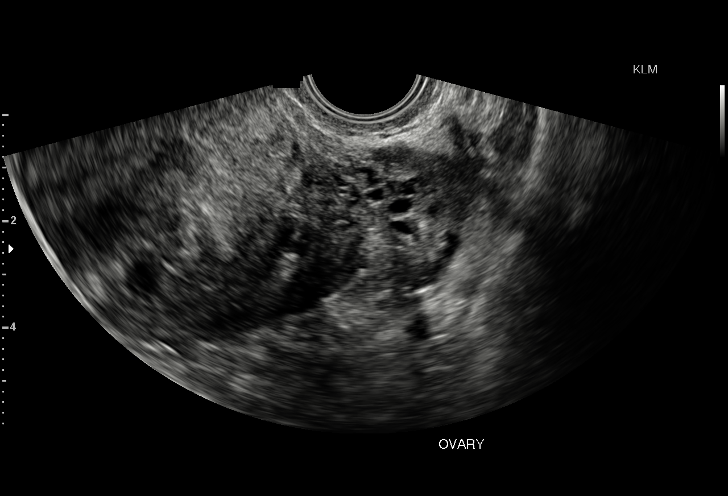
[im 57/72]
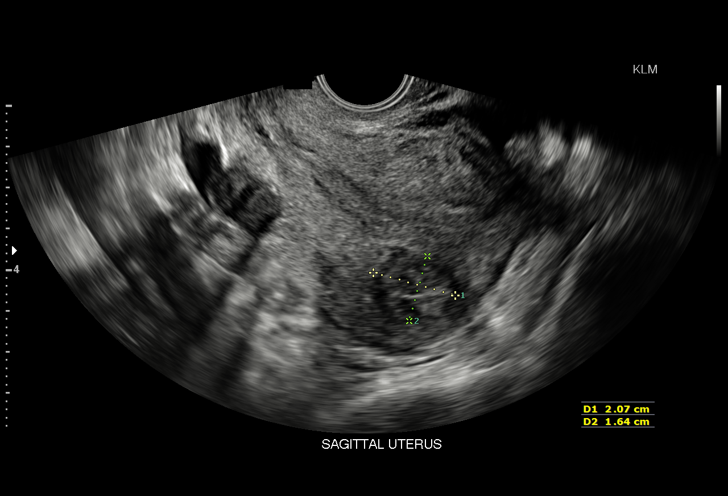
[im 60/72]
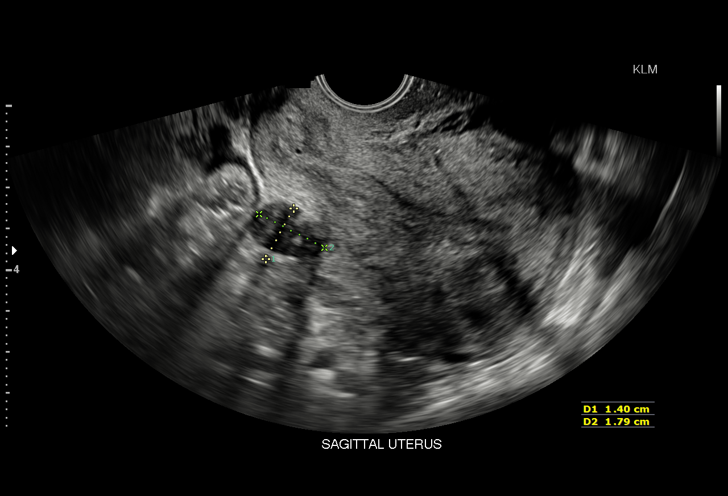
[im 66/72]
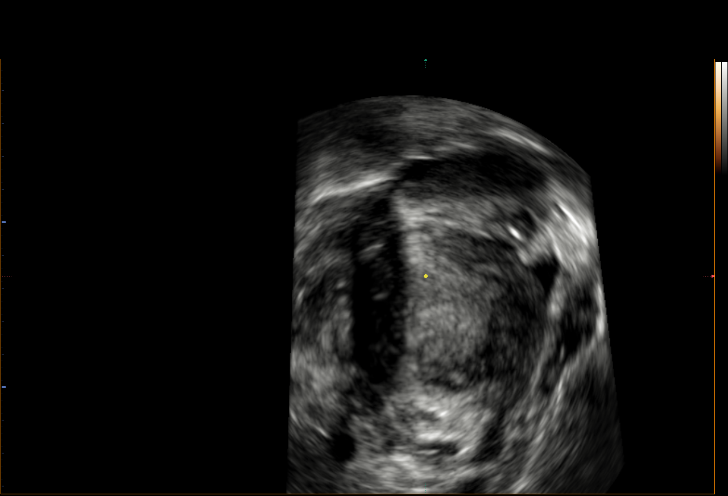
[im 72/72]
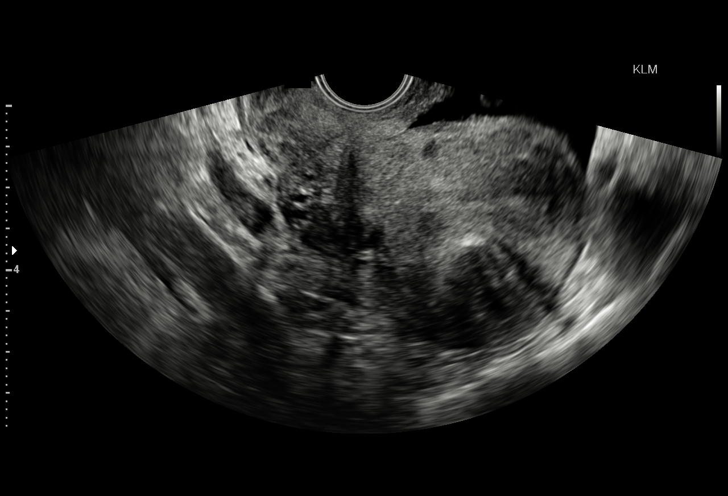

[15 of 25 positions shown; findings below may reference images not displayed]

FINDINGS: Uterus

Measurements: 9.3 x 6.5 x 6.2 cm. Two fibroids are noted, with 1
measuring 2.0 x 1.8 x 1.6 cm posteriorly in the fundus. The other
measures 1.7 x 1.7 x 1.4 cm posteriorly in the lower uterine
segment.

Endometrium

Thickness: 11.6 mm.  No focal abnormality visualized.

Right ovary

Measurements: 3.0 x 2.0 x 1.7 cm. Normal appearance/no adnexal mass.

Left ovary

Measurements: 3.3 x 2.3 x 1.6 cm. Normal appearance/no adnexal mass.

Pulsed Doppler evaluation of both ovaries demonstrates normal
low-resistance arterial and venous waveforms.

Other findings

Mild amount of complex fluid is noted in the posterior cul-de-sac
and both adnexal regions.
IMPRESSION: Two uterine fibroids are noted, with the largest measuring 2 cm.
Endometrium and ovaries appear normal.

Mild amount of complex fluid is again noted in posterior cul-de-sac
and both adnexal regions which may be physiologic.

## 2016-01-25 ENCOUNTER — Inpatient Hospital Stay (HOSPITAL_COMMUNITY)
Admission: AD | Admit: 2016-01-25 | Discharge: 2016-01-25 | Disposition: A | Discharge status: Home / Self Care | Payer: 59 | Source: Ambulatory Visit | Attending: Obstetrics | Admitting: Obstetrics

## 2016-01-25 DIAGNOSIS — N949 Unspecified condition associated with female genital organs and menstrual cycle: Secondary | ICD-10-CM

## 2016-01-25 DIAGNOSIS — F17210 Nicotine dependence, cigarettes, uncomplicated: Secondary | ICD-10-CM | POA: Insufficient documentation

## 2016-01-25 DIAGNOSIS — F419 Anxiety disorder, unspecified: Secondary | ICD-10-CM | POA: Insufficient documentation

## 2016-01-25 DIAGNOSIS — G8929 Other chronic pain: Secondary | ICD-10-CM

## 2016-01-25 DIAGNOSIS — N946 Dysmenorrhea, unspecified: Secondary | ICD-10-CM | POA: Insufficient documentation

## 2016-01-25 DIAGNOSIS — N809 Endometriosis, unspecified: Secondary | ICD-10-CM | POA: Insufficient documentation

## 2016-01-25 DIAGNOSIS — R109 Unspecified abdominal pain: Secondary | ICD-10-CM | POA: Diagnosis present

## 2016-01-25 DIAGNOSIS — R102 Pelvic and perineal pain: Secondary | ICD-10-CM | POA: Diagnosis not present

## 2016-01-25 LAB — URINALYSIS, ROUTINE W REFLEX MICROSCOPIC
Bilirubin Urine: NEGATIVE
Bilirubin Urine: NEGATIVE
GLUCOSE, UA: NEGATIVE mg/dL
GLUCOSE, UA: NEGATIVE mg/dL
KETONES UR: 15 mg/dL — AB
KETONES UR: 15 mg/dL — AB
LEUKOCYTES UA: NEGATIVE
NITRITE: POSITIVE — AB
NITRITE: NEGATIVE
PH: 6 (ref 5.0–8.0)
PROTEIN: 100 mg/dL — AB
PROTEIN: NEGATIVE mg/dL
Specific Gravity, Urine: 1.02 (ref 1.005–1.030)
Specific Gravity, Urine: 1.02 (ref 1.005–1.030)
pH: 6 (ref 5.0–8.0)

## 2016-01-25 LAB — URINE MICROSCOPIC-ADD ON: Bacteria, UA: NONE SEEN

## 2016-01-25 LAB — CBC
HCT: 37.2 % (ref 36.0–46.0)
Hemoglobin: 12.2 g/dL (ref 12.0–15.0)
MCH: 25.9 pg — ABNORMAL LOW (ref 26.0–34.0)
MCHC: 32.8 g/dL (ref 30.0–36.0)
MCV: 79 fL (ref 78.0–100.0)
PLATELETS: 360 10*3/uL (ref 150–400)
RBC: 4.71 MIL/uL (ref 3.87–5.11)
RDW: 16.5 % — AB (ref 11.5–15.5)
WBC: 9.5 10*3/uL (ref 4.0–10.5)

## 2016-01-25 LAB — POCT PREGNANCY, URINE: Preg Test, Ur: NEGATIVE

## 2016-01-25 MED ORDER — KETOROLAC TROMETHAMINE 60 MG/2ML IM SOLN
60.0000 mg | Freq: Once | INTRAMUSCULAR | Status: AC
  Administered 2016-01-25: 60 mg via INTRAMUSCULAR
  Filled 2016-01-25: qty 2

## 2016-01-25 MED ORDER — PROMETHAZINE HCL 25 MG PO TABS: 25.0000 mg | ORAL_TABLET | Freq: Four times a day (QID) | ORAL | Status: DC | PRN

## 2016-01-25 MED ORDER — TRAMADOL HCL 50 MG PO TABS
50.0000 mg | ORAL_TABLET | Freq: Once | ORAL | Status: AC
  Administered 2016-01-25: 50 mg via ORAL
  Filled 2016-01-25: qty 1

## 2016-01-25 NOTE — MAU Note (Signed)
Pt received to MAU rm 1 c/o intermittent, sharp abdominal pain 10/10 since 0630. Pt states that she started her period yesterday and has taken ibuprofen and tramadol without relief. [place]

## 2016-01-25 NOTE — Discharge Instructions (Signed)
Pain Medicine Instructions HOW CAN PAIN MEDICINE AFFECT ME? You were prescribed pain medicine. This medicine may:  Make you tired or sleepy.  Affect how well you can:  Drive  Do certain activities. Pain medicine may not make all of your pain go away. You should be comfortable enough to:  Move.  Breathe.  Take care of yourself. HOW OFTEN SHOULD I TAKE PAIN MEDICINE AND HOW MUCH SHOULD I TAKE?  Take pain medicine only as told by your doctor and only as needed for pain.  You do not need to take pain medicine if you are not having pain, unless your doctor tells you to do that.  You can take less than the prescribed dose if you find that less medicine helps your pain. WHAT SHOULD I AVOID WHILE I AM TAKING PAIN MEDICINE? Follow these instructions after you start taking pain medicine, while you are taking the medicine, and for 8 hours after you stop taking the medicine:  Do not drive.  Do not use machinery.  Do not use power tools.  Do not sign legal documents.  Do not drink alcohol.  Do not take sleeping pills.  Do not take care of children by yourself.  Do not do any activities that involve climbing or being in high places.  Do not go into any body of water unless there is an adult nearby who can watch and help you. This includes:  Lakes.  Rivers.  Oceans.  Spas.  Swimming pools. HOW CAN I KEEP OTHERS SAFE WHILE I AM TAKING PAIN MEDICINE?  Store your pain medicine as told by your doctor. Make sure that you keep it where children and pets cannot reach it.  Do not share your pain medicine with anyone.  Do not save any leftover pills. If you have any leftover pain medicine, get rid of it or destroy it as told by your doctor. WHAT ELSE DO I NEED TO KNOW ABOUT TAKING PAIN MEDICINE?  Use a poop (stool) softener if you have trouble pooping (constipation) because of your pain medicine. Eating more fruits and vegetables also helps with constipation.  Write down the  times when you take your pain medicine. Look at the times before you take your next dose of medicine.  If your pain is very bad, do not take more pills than told by your doctor. Call your doctor for help.  Your pain medicine might have acetaminophen in it. Do not take any other acetaminophen while you are taking this medicine. An overdose of acetaminophen can do very bad damage to your liver. If you are taking any medicines in addition to your pain medicine, check the active ingredients on those medicines to see if acetaminophen is listed. WHEN SHOULD I CALL MY DOCTOR?  Your medicine is not helping the pain.  You do either of these soon after you take the medicine:  Throw up (vomit).  Have watery poop (diarrhea).  You have new pain in areas that did not hurt before.  You have an allergic reaction to your medicine. This may include:  Feeling itchy.  Swelling.  Feeling dizzy.  Getting a new rash. WHEN SHOULD I CALL 911 OR GO TO THE EMERGENCY ROOM?  You feel dizzy or you faint.  You feel very confused.  You throw up again and again.  Your skin or lips turn pale or bluish in color.  You are:  Short of breath.  Breathing much more slowly than usual.  You have a very bad allergic  reaction to your medicine. This includes:  Developing a swollen tongue.  Having trouble breathing.   This information is not intended to replace advice given to you by your health care provider. Make sure you discuss any questions you have with your health care provider.   Document Released: 12/19/2007 Document Revised: 11/16/2014 Document Reviewed: 05/06/2014 Elsevier Interactive Patient Education [employer]. Endometriosis Endometriosis is a condition in which the tissue that lines the uterus (endometrium) grows outside of its normal location. The tissue may grow in many locations close to the uterus, but it commonly grows on the ovaries, fallopian tubes, vagina, or bowel. Because the  uterus expels, or sheds, its lining every menstrual cycle, there is bleeding wherever the endometrial tissue is located. This can cause pain because blood is irritating to tissues not normally exposed to it.  CAUSES  The cause of endometriosis is not known.  SIGNS AND SYMPTOMS  Often, there are no symptoms. When symptoms are present, they can vary with the location of the displaced tissue. Various symptoms can occur at different times. Although symptoms occur mainly during a woman's menstrual period, they can also occur midcycle and usually stop with menopause. Some people may go months with no symptoms at all. Symptoms may include:   Back or abdominal pain.   Heavier bleeding during periods.   Pain during intercourse.   Painful bowel movements.   Infertility. DIAGNOSIS  Your health care provider will do a physical exam and ask about your symptoms. Various tests may be done, such as:   Blood tests and urine tests. These are done to help rule out other problems.   Ultrasound. This test is done to look for abnormal tissue.   An X-ray of the lower bowel (barium enema).  Laparoscopy. In this procedure, a thin, lighted tube with a tiny camera on the end (laparoscope) is inserted into your abdomen. This helps your health care provider look for abnormal tissue to confirm the diagnosis. The health care provider may also remove a small piece of tissue (biopsy) from any abnormal tissue found. This tissue sample can then be sent to a lab so it can be looked at under a microscope. TREATMENT  Treatment will vary and may include:   Medicines to relieve pain. Nonsteroidal anti-inflammatory drugs (NSAIDs) are a type of pain medicine that can help to relieve the pain caused by endometriosis.  Hormonal therapy. When using hormonal therapy, periods are eliminated. This eliminates the monthly exposure to blood by the displaced endometrial tissue.   Surgery. Surgery may sometimes be done to  remove the abnormal endometrial tissue. In severe cases, surgery may be done to remove the fallopian tubes, uterus, and ovaries (hysterectomy). HOME CARE INSTRUCTIONS   Take all medicines as directed by your health care provider. Do not take aspirin because it may increase bleeding when you are not on hormonal therapy.   Avoid activities that produce pain, including sexual activity. SEEK MEDICAL CARE IF:  You have pelvic pain before, after, or during your periods.  You have pelvic pain between periods that gets worse during your period.  You have pelvic pain during or after sex.  You have pelvic pain with bowel movements or urination, especially during your period.  You have problems getting pregnant.  You have a fever. SEEK IMMEDIATE MEDICAL CARE IF:   Your pain is severe and is not responding to pain medicine.   You have severe nausea and vomiting, or you cannot keep foods down.   You  have pain that is limited to the right lower part of your abdomen.   You have swelling or increasing pain in your abdomen.   You see blood in your stool.  MAKE SURE YOU:   Understand these instructions.  Will watch your condition.  Will get help right away if you are not doing well or get worse.   This information is not intended to replace advice given to you by your health care provider. Make sure you discuss any questions you have with your health care provider.   Document Released: 06/29/2000 Document Revised: 07/23/2014 Document Reviewed: 02/27/2013 Elsevier Interactive Patient Education [employer].

## 2016-01-25 NOTE — MAU Provider Note (Signed)
CBC ordered and results reviewed. Hgb 12.2.  Toradol 60 mg IM given.    CNM to room to evaluate/examine pt and pt reported upset that she has waited so long today before being seen.  Explained that this is an emergency room and patients are seen in order of acuity.  Pt upset and asked to see a different provider.  Pt stable at this time.  Another MAU provider to see pt as soon as available.   LEFTWICH-KIRBY, Lattie Haw, CNM 2:22 PM   Chief Complaint:  Abdominal Pain   First Provider Initiated Contact with Patient 01/25/16 1434    HPI Danielle Dawson is a 35 y.o. G1P1001 who presents to maternity admissions reporting lower abdominal pain which started yesterday when she started her period.  States the pain always lasts 5 days.   States bleeding was heavy today.  . She reports vaginal bleeding, no vaginal itching/burning, urinary symptoms, h/a, dizziness, n/v, or fever/chills.  Now, she feels better after the Toradol.  Long history of dysmenorrhea. States has been told she has endometriosis.  Has been seeing Dr Kerin Perna for "fertility" but "didn't like him, he's weird" so just saw Dr Benjie Karvonen for same. Had a D&C with polypectomy in 2016.   Never told her she had endometriosis.  Gets her Tramadoll from her primary care doctor.    RN Note: Pt received to MAU rm 1 c/o intermittent, sharp abdominal pain 10/10 since 0630. Pt states that she started her period yesterday and has taken ibuprofen and tramadol without relief. TERRI L TAEUBER  Pt has hx of endometriosis, period started yesterday, severe lower abd pain started @ 0630 this a.m. States bleeding is heavy - has changed two pads this a.m. Had vomiting once this morning, none currently.        Past Medical History: Past Medical History  Diagnosis Date  . Endometriosis   . Fibroid   . SVD (spontaneous vaginal delivery)     x 1 in [employer].  . Anxiety     no meds    Past obstetric history: OB History  Gravida Para Term Preterm AB SAB TAB  Ectopic Multiple Living  1 1 1       1     # Outcome Date GA Lbr Len/2nd Weight Sex Delivery Anes PTL Lv  1 Term               Past Surgical History: Past Surgical History  Procedure Laterality Date  . Hysteroscopy w/d&c N/A 10/08/2014    Procedure: HYSTEROSCOPY/POLYPECTOMY/SUCTION DILATATION AND CURETTAGE WITH POST OP VAGINAL EXAM;  Surgeon: Governor Specking, MD;  Location: [facility];  Service: Gynecology;  Laterality: N/A;    Family History: Family History  Problem Relation Age of Onset  . Diabetes Maternal Grandfather   . Hypertension Maternal Grandfather   . Hypertension Mother   . Drug abuse Mother     recovery since pt 6y/o  . Heart disease Father   . Diabetes Maternal Grandmother   . Vision loss Maternal Grandmother   . Alcohol abuse Sister   . Depression Maternal Aunt   . Alcohol abuse Maternal Aunt   . Anxiety disorder Maternal Aunt   . Alcohol abuse Cousin   . Drug abuse Cousin   . Drug abuse Maternal Aunt   . Drug abuse Maternal Aunt   . Alcohol abuse Maternal Aunt   . Alcohol abuse Maternal Aunt     Social History: Social History  Substance Use Topics  . Smoking status: Current  Some Day Smoker    Types: Cigars    Last Attempt to Quit: 06/01/2012  . Smokeless tobacco: Never Used  . Alcohol Use: No     Comment: socially    Allergies: No Known Allergies  Meds:  Prescriptions prior to admission  Medication Sig Dispense Refill Last Dose  . ALPRAZolam (XANAX) 0.5 MG tablet Take 0.5 mg by mouth 2 (two) times daily as needed for anxiety.   01/24/2016 at Unknown time  . Cholecalciferol (VITAMIN D3 ADULT GUMMIES) 1000 UNITS CHEW Chew 2 each by mouth daily.   01/24/2016 at Unknown time  . ibuprofen (ADVIL,MOTRIN) 200 MG tablet Take 4 tablets (800 mg total) by mouth every 8 (eight) hours as needed for moderate pain. Do not take within 6 hours of Toradol. 30 tablet 0 01/25/2016 at Unknown time  . Multiple Vitamin (MULTIVITAMIN WITH MINERALS) TABS tablet Take 1 tablet  by mouth daily.   01/24/2016 at Unknown time  . Multiple Vitamins-Minerals (HAIR SKIN NAILS PO) Take 3 tablets by mouth daily.   01/24/2016 at Unknown time  . promethazine (PHENERGAN) 12.5 MG tablet Take 12.5 mg by mouth daily as needed for nausea or vomiting.    Past Month at Unknown time  . traMADol (ULTRAM) 50 MG tablet Take 1 tablet (50 mg total) by mouth every 6 (six) hours as needed for moderate pain. 30 tablet 0 01/25/2016 at Unknown time    I have reviewed patient's Past Medical Hx, Surgical Hx, Family Hx, Social Hx, medications and allergies.  ROS:  Review of Systems  Constitutional: Negative for fever and chills.  Gastrointestinal: Positive for abdominal pain. Negative for nausea, vomiting, diarrhea and constipation.  Genitourinary: Positive for vaginal bleeding, menstrual problem and pelvic pain. Negative for dysuria and vaginal discharge.  Musculoskeletal: Negative for back pain.   Other systems negative     Physical Exam  Patient Vitals for the past 24 hrs:  BP Temp Temp src Pulse Resp Height Weight  01/25/16 1131 125/75 mmHg 98 F (36.7 C) Oral 69 22 5\' 4"  (1.626 m) 182 lb (82.555 kg)   Constitutional: Well-developed, well-nourished female in no acute distress.  Cardiovascular: normal rate and rhythm, no ectopy audible, S1 & S2 heard, no murmur Respiratory: normal effort, no distress. Lungs CTAB with no wheezes or crackles GI: Abd soft, non-tender.  Nondistended.  No rebound, No guarding.  Bowel Sounds audible  MS: Extremities nontender, no edema, normal ROM Neurologic: Alert and oriented x 4.   Grossly nonfocal. GU: Neg CVAT. Skin:  Warm and Dry Psych:  Affect appropriate.  PELVIC EXAM: Cervix pink, visually closed, without lesion, scant white creamy discharge, vaginal walls and external genitalia normal Bimanual exam: Cervix firm, anterior, neg CMT, uterus tender, nonenlarged, adnexa with diffuse bilateral tenderness, but no enlargement, or mass    Labs: Results  for orders placed or performed during the hospital encounter of 01/25/16 (from the past 24 hour(s))  Urinalysis, Routine w reflex microscopic (not at [facility])     Status: Abnormal   Collection Time: 01/25/16 11:35 AM  Result Value Ref Range   Color, Urine RED (A) YELLOW   APPearance CLOUDY (A) CLEAR   Specific Gravity, Urine 1.020 1.005 - 1.030   pH 6.0 5.0 - 8.0   Glucose, UA NEGATIVE NEGATIVE mg/dL   Hgb urine dipstick LARGE (A) NEGATIVE   Bilirubin Urine NEGATIVE NEGATIVE   Ketones, ur 15 (A) NEGATIVE mg/dL   Protein, ur 100 (A) NEGATIVE mg/dL   Nitrite POSITIVE (A) NEGATIVE  Leukocytes, UA SMALL (A) NEGATIVE  Urine microscopic-add on     Status: Abnormal   Collection Time: 01/25/16 11:35 AM  Result Value Ref Range   Squamous Epithelial / LPF 0-5 (A) NONE SEEN   WBC, UA 0-5 0 - 5 WBC/hpf   RBC / HPF TOO NUMEROUS TO COUNT 0 - 5 RBC/hpf   Bacteria, UA NONE SEEN NONE SEEN   Urine-Other MICROSCOPIC EXAM PERFORMED ON UNCONCENTRATED URINE   Pregnancy, urine POC     Status: None   Collection Time: 01/25/16 12:33 PM  Result Value Ref Range   Preg Test, Ur NEGATIVE NEGATIVE  Urinalysis, Routine w reflex microscopic (not at [facility])     Status: Abnormal   Collection Time: 01/25/16  1:35 PM  Result Value Ref Range   Color, Urine YELLOW YELLOW   APPearance CLEAR CLEAR   Specific Gravity, Urine 1.020 1.005 - 1.030   pH 6.0 5.0 - 8.0   Glucose, UA NEGATIVE NEGATIVE mg/dL   Hgb urine dipstick SMALL (A) NEGATIVE   Bilirubin Urine NEGATIVE NEGATIVE   Ketones, ur 15 (A) NEGATIVE mg/dL   Protein, ur NEGATIVE NEGATIVE mg/dL   Nitrite NEGATIVE NEGATIVE   Leukocytes, UA NEGATIVE NEGATIVE  Urine microscopic-add on     Status: Abnormal   Collection Time: 01/25/16  1:35 PM  Result Value Ref Range   Squamous Epithelial / LPF 0-5 (A) NONE SEEN   WBC, UA 0-5 0 - 5 WBC/hpf   RBC / HPF 0-5 0 - 5 RBC/hpf   Bacteria, UA RARE (A) NONE SEEN  CBC     Status: Abnormal   Collection Time: 01/25/16  1:44 PM   Result Value Ref Range   WBC 9.5 4.0 - 10.5 K/uL   RBC 4.71 3.87 - 5.11 MIL/uL   Hemoglobin 12.2 12.0 - 15.0 g/dL   HCT 37.2 36.0 - 46.0 %   MCV 79.0 78.0 - 100.0 fL   MCH 25.9 (L) 26.0 - 34.0 pg   MCHC 32.8 30.0 - 36.0 g/dL   RDW 16.5 (H) 11.5 - 15.5 %   Platelets 360 150 - 400 K/uL   Second urine sample was a recollection. First UA was contaminated with blood     Imaging:  No results found.  MAU Course/MDM: I have ordered labs as follows:  CBC and UA      WBC not elevated which effectively rules out appendicitis.  Pain pattern is consistent with usual pattern of dysmenorrhea. Imaging ordered:  none Results reviewed.   Consult Dr Ronita Hipps.   Treatments in MAU included Toradol injection.  States feels better.  Phenergan added to Tramadol for potentiation of effect. Pt stable at time of discharge.  Assessment: Chronic Pelvic pain Endometriosis Dysmenorrhea  Plan: Discharge home Recommend follow up with Dr Benjie Karvonen.  Continue plan of care for pain per Primary doctor Rx sent for Phenergan for potentiation of her Tramadol (has Rx at home) Encouraged to return here or to other Urgent Care/ED if she develops worsening of symptoms, increase in pain, fever, or other concerning symptoms.         Medication List    ASK your doctor about these medications        ALPRAZolam 0.5 MG tablet  Commonly known as:  XANAX  Take 0.5 mg by mouth 2 (two) times daily as needed for anxiety.     HAIR SKIN NAILS PO  Take 3 tablets by mouth daily.     ibuprofen 200 MG tablet  Commonly known  as:  ADVIL,MOTRIN  Take 4 tablets (800 mg total) by mouth every 8 (eight) hours as needed for moderate pain. Do not take within 6 hours of Toradol.     multivitamin with minerals Tabs tablet  Take 1 tablet by mouth daily.     promethazine 12.5 MG tablet  Commonly known as:  PHENERGAN  Take 12.5 mg by mouth daily as needed for nausea or vomiting.     traMADol 50 MG tablet  Commonly known as:  ULTRAM   Take 1 tablet (50 mg total) by mouth every 6 (six) hours as needed for moderate pain.     VITAMIN D3 ADULT GUMMIES 1000 units Chew  Generic drug:  Cholecalciferol  Chew 2 each by mouth daily.         Hansel Feinstein CNM, MSN Certified Nurse-Midwife 01/25/2016 2:59 PM

## 2016-01-25 NOTE — MAU Note (Signed)
Pt has hx of endometriosis, period started yesterday, severe lower abd pain started @ 0630 this a.m.  States bleeding is heavy - has changed two pads this a.m.  Had vomiting once this morning, none currently.

## 2016-01-26 LAB — CULTURE, OB URINE

## 2016-08-26 ENCOUNTER — Emergency Department (HOSPITAL_COMMUNITY): Payer: 59

## 2016-08-26 ENCOUNTER — Emergency Department (HOSPITAL_COMMUNITY)
Admission: EM | Admit: 2016-08-26 | Discharge: 2016-08-26 | Disposition: A | Discharge status: Home / Self Care | Payer: 59 | Attending: Emergency Medicine | Admitting: Emergency Medicine

## 2016-08-26 ENCOUNTER — Encounter (HOSPITAL_COMMUNITY): Payer: Self-pay | Admitting: *Deleted

## 2016-08-26 DIAGNOSIS — J069 Acute upper respiratory infection, unspecified: Secondary | ICD-10-CM | POA: Insufficient documentation

## 2016-08-26 DIAGNOSIS — Z79899 Other long term (current) drug therapy: Secondary | ICD-10-CM | POA: Diagnosis not present

## 2016-08-26 DIAGNOSIS — R05 Cough: Secondary | ICD-10-CM

## 2016-08-26 DIAGNOSIS — F17290 Nicotine dependence, other tobacco product, uncomplicated: Secondary | ICD-10-CM | POA: Insufficient documentation

## 2016-08-26 DIAGNOSIS — R059 Cough, unspecified: Secondary | ICD-10-CM

## 2016-08-26 MED ORDER — IBUPROFEN 800 MG PO TABS: 800.0000 mg | ORAL_TABLET | Freq: Three times a day (TID) | ORAL | 0 refills | Status: DC

## 2016-08-26 MED ORDER — BENZONATATE 100 MG PO CAPS: 100.0000 mg | ORAL_CAPSULE | Freq: Three times a day (TID) | ORAL | 0 refills | Status: DC

## 2016-08-26 NOTE — ED Triage Notes (Signed)
Pt reports pain with breathing in, pain in the right chest that radiates around to her back, productive cough and throat pain.

## 2016-08-26 NOTE — ED Provider Notes (Signed)
[facility] DEPT Provider Note   CSN: [id number] Arrival date & time: 08/26/16  1815     History   Chief Complaint Chief Complaint  Patient presents with  . Cough    HPI Danielle Dawson is a 35 y.o. female.  HPI   Danielle Dawson is a 36 y.o. female, with a history of Anxiety, presenting to the ED with a cough, congestion, subjective fever, chills, and sore throat for the last week. Cough became productive with green sputum a few days ago. She also endorses pain to the left ribs that is worse with palpation or coughing. She has tried Robitussin with minimal relief. Denies shortness of breath, N/V/D, abdominal pain, or any other complaints.      Past Medical History:  Diagnosis Date  . Anxiety    no meds  . Endometriosis   . Fibroid   . SVD (spontaneous vaginal delivery)    x 1 in [place].    Patient Active Problem List   Diagnosis Date Noted  . GAD (generalized anxiety disorder) 07/21/2015  . Leiomyoma of uterus, unspecified 03/19/2014  . 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 Routine gynecological examination 11/06/2012    Past Surgical History:  Procedure Laterality Date  . HYSTEROSCOPY W/D&C N/A 10/08/2014   Procedure: HYSTEROSCOPY/POLYPECTOMY/SUCTION DILATATION AND CURETTAGE WITH POST OP VAGINAL EXAM;  Surgeon: Governor Specking, MD;  Location: [facility];  Service: Gynecology;  Laterality: N/A;    OB History    Gravida Para Term Preterm AB Living   1 1 1     1    SAB TAB Ectopic Multiple Live Births                   Home Medications    Prior to Admission medications   Medication Sig Start Date End Date Taking? Authorizing Provider  ALPRAZolam Duanne Moron) 0.5 MG tablet Take 0.5 mg by mouth 2 (two) times daily as needed for anxiety.   Yes Historical Provider, MD  buPROPion (WELLBUTRIN XL) 150 MG 24 hr tablet Take 150 mg by mouth daily.   Yes Historical Provider, MD  Cyanocobalamin (B-12 PO) Take 1 tablet by mouth  daily.   Yes Historical Provider, MD  liraglutide (VICTOZA) 18 MG/3ML SOPN Inject 12 mg into the skin daily.   Yes Historical Provider, MD  Multiple Vitamin (MULTIVITAMIN WITH MINERALS) TABS tablet Take 1 tablet by mouth daily.   Yes Historical Provider, MD  promethazine (PHENERGAN) 25 MG tablet Take 1 tablet (25 mg total) by mouth every 6 (six) hours as needed for nausea or vomiting. 01/25/16  Yes Seabron Spates, CNM  traMADol (ULTRAM) 50 MG tablet Take 1 tablet (50 mg total) by mouth every 6 (six) hours as needed for moderate pain. 05/03/15  Yes Manya Silvas, CNM  benzonatate (TESSALON) 100 MG capsule Take 1 capsule (100 mg total) by mouth every 8 (eight) hours. 08/26/16   Shaka Zech C Alissia Lory, PA-C  ibuprofen (ADVIL,MOTRIN) 800 MG tablet Take 1 tablet (800 mg total) by mouth 3 (three) times daily. 08/26/16   Lorayne Bender, PA-C    Family History Family History  Problem Relation Age of Onset  . Diabetes Maternal Grandfather   . Hypertension Maternal Grandfather   . Hypertension Mother   . Drug abuse Mother     recovery since pt 6y/o  . Heart disease Father   . Diabetes Maternal Grandmother   . Vision loss Maternal Grandmother   . Alcohol abuse Cousin   .  Drug abuse Cousin   . Drug abuse Maternal Aunt   . Alcohol abuse Maternal Aunt   . Alcohol abuse Sister   . Depression Maternal Aunt   . Alcohol abuse Maternal Aunt   . Anxiety disorder Maternal Aunt   . Drug abuse Maternal Aunt   . Alcohol abuse Maternal Aunt     Social History Social History  Substance Use Topics  . Smoking status: Current Some Day Smoker    Types: Cigars    Last attempt to quit: 06/01/2012  . Smokeless tobacco: Never Used  . Alcohol use No     Comment: socially     Allergies   Shellfish allergy   Review of Systems Review of Systems  Constitutional: Positive for chills and fever (subjective).  HENT: Positive for congestion and sore throat.   Respiratory: Positive for cough. Negative for shortness of  breath.   Gastrointestinal: Negative for abdominal pain, diarrhea, nausea and vomiting.  Musculoskeletal:       Left rib tenderness  Skin: Negative for rash.  All other systems reviewed and are negative.    Physical Exam Updated Vital Signs BP 131/86 (BP Location: Right Arm)   Pulse 75   Temp 98.7 F (37.1 C) (Oral)   Resp 18   LMP 07/31/2016   SpO2 98%   Physical Exam  Constitutional: She appears well-developed and well-nourished. No distress.  HENT:  Head: Normocephalic and atraumatic.  Mouth/Throat: Oropharynx is clear and moist.  Eyes: Conjunctivae are normal.  Neck: Normal range of motion. Neck supple.  Cardiovascular: Normal rate, regular rhythm, normal heart sounds and intact distal pulses.   Pulmonary/Chest: Effort normal and breath sounds normal. No respiratory distress. She exhibits tenderness.  Abdominal: Soft. There is no tenderness. There is no guarding.  Musculoskeletal: She exhibits tenderness. She exhibits no edema.  Tenderness to the left ribs extending from posterior ribs, around the left flank, and then to the ribs under the left breast. No instability, crepitus, swelling, or other abnormalities noted.   Lymphadenopathy:    She has no cervical adenopathy.  Neurological: She is alert.  Skin: Skin is warm and dry. She is not diaphoretic.  Psychiatric: She has a normal mood and affect. Her behavior is normal.  Nursing note and vitals reviewed.    ED Treatments / Results  Labs (all labs ordered are listed, but only abnormal results are displayed) Labs Reviewed - No data to display  EKG  EKG Interpretation None       Radiology Dg Chest 2 View  Result Date: 08/26/2016 CLINICAL DATA:  Productive cough, LEFT chest soreness and dyspnea for 10 days. EXAM: CHEST  2 VIEW COMPARISON:  Chest radiograph September 23, 2012 FINDINGS: Cardiomediastinal silhouette is normal. No pleural effusions or focal consolidations. Trachea projects midline and there is no  pneumothorax. Soft tissue planes and included osseous structures are non-suspicious. IMPRESSION: Normal chest. Electronically Signed   By: Elon Alas M.D.   On: 08/26/2016 19:22    Procedures Procedures (including critical care time)  Medications Ordered in ED Medications - No data to display   Initial Impression / Assessment and Plan / ED Course  I have reviewed the triage vital signs and the nursing notes.  Pertinent labs & imaging results that were available during my care of the patient were reviewed by me and considered in my medical decision making (see chart for details).     Patient presents with symptoms consistent with URI versus influenza. She is well-appearing with no  signs of distress. No abnormalities on chest x-ray. PCP follow-up as needed. Home care and return precautions discussed. Patient voices understanding of all instructions and is comfortable with discharge.    Vitals:   08/26/16 1821 08/26/16 1932  BP: 131/86 116/83  Pulse: 75 63  Resp: 18 18  Temp: 98.7 F (37.1 C)   TempSrc: Oral   SpO2: 98% 99%      Final Clinical Impressions(s) / ED Diagnoses   Final diagnoses:  Cough  Upper respiratory tract infection, unspecified type    New Prescriptions Discharge Medication List as of 08/26/2016  7:37 PM    START taking these medications   Details  benzonatate (TESSALON) 100 MG capsule Take 1 capsule (100 mg total) by mouth every 8 (eight) hours., Starting Sun 08/26/2016, Print    ibuprofen (ADVIL,MOTRIN) 800 MG tablet Take 1 tablet (800 mg total) by mouth 3 (three) times daily., Starting Sun 08/26/2016, Print         Lorayne Bender, PA-C 08/27/16 1008    Virgel Manifold, MD 09/03/16 [phone]

## 2016-08-26 NOTE — Discharge Instructions (Signed)
Your chest xray was negative for any abnormalities, including signs of pneumonia. Your symptoms are consistent with a viral illness. Viruses do not require antibiotics. Treatment is symptomatic care and it is important to note that these symptoms may last for 7-14 days. Symptoms will be intensified and complicated by dehydration. Dehydration can also extend the duration of symptoms. Drink plenty of fluids and get plenty of rest. You should be drinking at least half a liter of water an hour to stay hydrated. Electrolyte drinks are also encouraged. You should be drinking enough fluids to make your urine light yellow, almost clear. If this is not the case, you are not drinking enough water. Ibuprofen, Naproxen, or Tylenol for pain or fever. Tessalon for cough. Plain Mucinex may help relieve congestion. Saline sinus rinses and saline nasal sprays may also help relieve congestion. Warm liquids or Chloraseptic spray may help soothe a sore throat. Follow up with a primary care provider, as needed, for any future management of this issue.

## 2017-04-10 ENCOUNTER — Inpatient Hospital Stay (HOSPITAL_COMMUNITY)
Admission: AD | Admit: 2017-04-10 | Discharge: 2017-04-10 | Disposition: A | Discharge status: Home / Self Care | Payer: 59 | Source: Ambulatory Visit | Attending: Obstetrics and Gynecology | Admitting: Obstetrics and Gynecology

## 2017-04-10 ENCOUNTER — Encounter (HOSPITAL_COMMUNITY): Payer: Self-pay

## 2017-04-10 ENCOUNTER — Inpatient Hospital Stay (HOSPITAL_COMMUNITY): Payer: 59

## 2017-04-10 DIAGNOSIS — D259 Leiomyoma of uterus, unspecified: Secondary | ICD-10-CM | POA: Diagnosis not present

## 2017-04-10 DIAGNOSIS — N809 Endometriosis, unspecified: Secondary | ICD-10-CM | POA: Diagnosis not present

## 2017-04-10 DIAGNOSIS — R1032 Left lower quadrant pain: Secondary | ICD-10-CM | POA: Insufficient documentation

## 2017-04-10 DIAGNOSIS — Z3202 Encounter for pregnancy test, result negative: Secondary | ICD-10-CM | POA: Diagnosis not present

## 2017-04-10 DIAGNOSIS — F17290 Nicotine dependence, other tobacco product, uncomplicated: Secondary | ICD-10-CM | POA: Insufficient documentation

## 2017-04-10 DIAGNOSIS — R109 Unspecified abdominal pain: Secondary | ICD-10-CM | POA: Diagnosis present

## 2017-04-10 LAB — URINALYSIS, ROUTINE W REFLEX MICROSCOPIC
BACTERIA UA: NONE SEEN
BILIRUBIN URINE: NEGATIVE
Glucose, UA: NEGATIVE mg/dL
KETONES UR: NEGATIVE mg/dL
LEUKOCYTES UA: NEGATIVE
Nitrite: NEGATIVE
PROTEIN: NEGATIVE mg/dL
Specific Gravity, Urine: 1.028 (ref 1.005–1.030)
pH: 5 (ref 5.0–8.0)

## 2017-04-10 LAB — CBC
HEMATOCRIT: 34.7 % — AB (ref 36.0–46.0)
HEMOGLOBIN: 10.9 g/dL — AB (ref 12.0–15.0)
MCH: 25.2 pg — AB (ref 26.0–34.0)
MCHC: 31.4 g/dL (ref 30.0–36.0)
MCV: 80.3 fL (ref 78.0–100.0)
Platelets: 380 10*3/uL (ref 150–400)
RBC: 4.32 MIL/uL (ref 3.87–5.11)
RDW: 16 % — ABNORMAL HIGH (ref 11.5–15.5)
WBC: 7.6 10*3/uL (ref 4.0–10.5)

## 2017-04-10 LAB — POCT PREGNANCY, URINE: PREG TEST UR: NEGATIVE

## 2017-04-10 MED ORDER — ONDANSETRON 8 MG PO TBDP
8.0000 mg | ORAL_TABLET | Freq: Once | ORAL | Status: AC
  Administered 2017-04-10: 8 mg via ORAL
  Filled 2017-04-10: qty 1

## 2017-04-10 MED ORDER — IBUPROFEN 800 MG PO TABS: 800.0000 mg | ORAL_TABLET | Freq: Three times a day (TID) | ORAL | 3 refills | Status: DC | PRN

## 2017-04-10 MED ORDER — KETOROLAC TROMETHAMINE 60 MG/2ML IM SOLN
60.0000 mg | Freq: Once | INTRAMUSCULAR | Status: AC
  Administered 2017-04-10: 60 mg via INTRAMUSCULAR
  Filled 2017-04-10: qty 2

## 2017-04-10 MED ORDER — ONDANSETRON 4 MG PO TBDP: 4.0000 mg | ORAL_TABLET | Freq: Three times a day (TID) | ORAL | 0 refills | Status: DC | PRN

## 2017-04-10 NOTE — MAU Note (Signed)
Pt presents to MAU with complaints of abdominal pain that started this morning around 530 am. Reports she started her menstrual cycle yesterday. Hx of endometriosis.

## 2017-04-10 NOTE — Discharge Instructions (Signed)
In late 2019, the [facility] will be moving to the [place]. At that time, the MAU will no longer serve non-pregnant patients. We encourage you to establish care with a provider before that time, so that you can be seen with any GYN concerns, like vaginal discharge, urinary tract infection, etc.. in a timely manner. In order to make the office visit more convenient, the [facility] for [place] at [facility] will be offering evening hours from 4pm-8pm on Mondays starting 03/25/17. There will be same-day appointments, walk-in appointments and scheduled appointments available during this time.    Center for [place] @ [facility] [phone]  For urgent needs, [facility] is also available for management of urgent GYN complaints such as vaginal discharge.   [employer] extends eligibility for family planning services to reduce unintended pregnancies and improve the well-being of children and families.   Eligible individuals whose income is at or below 195% of the federal poverty level and who are:  - [place] citizens, documented immigrants or qualified aliens;  - Residents of [place];  - Not incarcerated; and  - Not pregnant.   [employer] Family Planning Contact Information:  Medical Assistance Clinical Section Phone: [phone] Email: [email or web address]    Dysmenorrhea Menstrual cramps (dysmenorrhea) are caused by the muscles of the uterus tightening (contracting) during a menstrual period. For some women, this discomfort is merely bothersome. For others, dysmenorrhea can be severe enough to interfere with everyday activities for a few days each month. Primary dysmenorrhea is menstrual cramps that last a couple of days when you start having menstrual periods or soon after. This often begins after a teenager starts having her period. As a woman gets older or has a baby, the cramps will usually lessen or  disappear. Secondary dysmenorrhea begins later in life, lasts longer, and the pain may be stronger than primary dysmenorrhea. The pain may start before the period and last a few days after the period. What are the causes? Dysmenorrhea is usually caused by an underlying problem, such as:  The tissue lining the uterus grows outside of the uterus in other areas of the body (endometriosis).  The endometrial tissue, which normally lines the uterus, is found in or grows into the muscular walls of the uterus (adenomyosis).  The pelvic blood vessels are engorged with blood just before the menstrual period (pelvic congestive syndrome).  Overgrowth of cells (polyps) in the lining of the uterus or cervix.  Falling down of the uterus (prolapse) because of loose or stretched ligaments.  Depression.  Bladder problems, infection, or inflammation.  Problems with the intestine, a tumor, or irritable bowel syndrome.  Cancer of the female organs or bladder.  A severely tipped uterus.  A very tight opening or closed cervix.  Noncancerous tumors of the uterus (fibroids).  Pelvic inflammatory disease (PID).  Pelvic scarring (adhesions) from a previous surgery.  Ovarian cyst.  An intrauterine device (IUD) used for birth control.  What increases the risk? You may be at greater risk of dysmenorrhea if:  You are younger than age 24.  You started puberty early.  You have irregular or heavy bleeding.  You have never given birth.  You have a family history of this problem.  You are a smoker.  What are the signs or symptoms?  Cramping or throbbing pain in your lower abdomen.  Headaches.  Lower back pain.  Nausea or vomiting.  Diarrhea.  Sweating or dizziness.  Loose  stools. How is this diagnosed? A diagnosis is based on your history, symptoms, physical exam, diagnostic tests, or procedures. Diagnostic tests or procedures may include:  Blood tests.  Ultrasonography.  An  examination of the lining of the uterus (dilation and curettage, D&C).  An examination inside your abdomen or pelvis with a scope (laparoscopy).  X-rays.  CT scan.  MRI.  An examination inside the bladder with a scope (cystoscopy).  An examination inside the intestine or stomach with a scope (colonoscopy, gastroscopy).  How is this treated? Treatment depends on the cause of the dysmenorrhea. Treatment may include:  Pain medicine prescribed by your health care provider.  Birth control pills or an IUD with progesterone hormone in it.  Hormone replacement therapy.  Nonsteroidal anti-inflammatory drugs (NSAIDs). These may help stop the production of prostaglandins.  Surgery to remove adhesions, endometriosis, ovarian cyst, or fibroids.  Removal of the uterus (hysterectomy).  Progesterone shots to stop the menstrual period.  Cutting the nerves on the sacrum that go to the female organs (presacral neurectomy).  Electric current to the sacral nerves (sacral nerve stimulation).  Antidepressant medicine.  Psychiatric therapy, counseling, or group therapy.  Exercise and physical therapy.  Meditation and yoga therapy.  Acupuncture.  Follow these instructions at home:  Only take over-the-counter or prescription medicines as directed by your health care provider.  Place a heating pad or hot water bottle on your lower back or abdomen. Do not sleep with the heating pad.  Use aerobic exercises, walking, swimming, biking, and other exercises to help lessen the cramping.  Massage to the lower back or abdomen may help.  Stop smoking.  Avoid alcohol and caffeine. Contact a health care provider if:  Your pain does not get better with medicine.  You have pain with sexual intercourse.  Your pain increases and is not controlled with medicines.  You have abnormal vaginal bleeding with your period.  You develop nausea or vomiting with your period that is not controlled with  medicine. Get help right away if: You pass out. This information is not intended to replace advice given to you by your health care provider. Make sure you discuss any questions you have with your health care provider. Document Released: 07/02/2005 Document Revised: 12/08/2015 Document Reviewed: 12/18/2012 Elsevier Interactive Patient Education  2017 [employer].

## 2017-04-10 NOTE — Progress Notes (Signed)
D/c instructions reviewed with pt.  Medications discussed & pt able to teach back when to take & what taking for.  Work note given.  Pt denies ques/concerns.  Pt d/c in stable condition with family.

## 2017-04-10 NOTE — MAU Provider Note (Signed)
History     CSN: [id number]  Arrival date and time: 04/10/17 5093  First Provider Initiated Contact with Patient 04/10/17 1038      Chief Complaint  Patient presents with  . Abdominal Pain   HPI Danielle Dawson is a 36 y.o. female who presents with abdominal pain. Hx of endometriosis that she says was diagnosed in 2006. Reports abdominal pain with onset of menses every month. Typically treats pain with advil & ultram; if that doesn't work states she comes to the hospital. Current episode of pain began at 5 this morning. Reports LLQ pain that is intermittent & feels like contractions. Rates pain 10/10. Took advil this morning without relief. Lying on her back makes pain worse. Endorses nausea. Denies vomiting, diarrhea, constipation, fever/chills, or dysuria. Last BM was yesterday. States she sees Dr. Alwyn Pea at [facility] ob[facility]gyn; was last seen in June.   Past Medical History:  Diagnosis Date  . Anxiety    no meds  . Endometriosis   . Fibroid   . SVD (spontaneous vaginal delivery)    x 1 in [place].    Past Surgical History:  Procedure Laterality Date  . HYSTEROSCOPY W/D&C N/A 10/08/2014   Procedure: HYSTEROSCOPY/POLYPECTOMY/SUCTION DILATATION AND CURETTAGE WITH POST OP VAGINAL EXAM;  Surgeon: Governor Specking, MD;  Location: [facility];  Service: Gynecology;  Laterality: N/A;    Family History  Problem Relation Age of Onset  . Diabetes Maternal Grandfather   . Hypertension Maternal Grandfather   . Hypertension Mother   . Drug abuse Mother        recovery since pt 6y/o  . Heart disease Father   . Diabetes Maternal Grandmother   . Vision loss Maternal Grandmother   . Alcohol abuse Cousin   . Drug abuse Cousin   . Drug abuse Maternal Aunt   . Alcohol abuse Maternal Aunt   . Alcohol abuse Sister   . Depression Maternal Aunt   . Alcohol abuse Maternal Aunt   . Anxiety disorder Maternal Aunt   . Drug abuse Maternal Aunt   . Alcohol abuse Maternal Aunt     Social History   Substance Use Topics  . Smoking status: Current Some Day Smoker    Types: Cigars    Last attempt to quit: 06/01/2012  . Smokeless tobacco: Never Used  . Alcohol use No     Comment: socially    Allergies:  Allergies  Allergen Reactions  . Shellfish Allergy Itching    Prescriptions Prior to Admission  Medication Sig Dispense Refill Last Dose  . ALPRAZolam (XANAX) 0.5 MG tablet Take 0.5 mg by mouth 2 (two) times daily as needed for anxiety.   Past Month at Unknown time  . Cyanocobalamin (B-12 PO) Take 1 tablet by mouth daily.   04/09/2017 at Unknown time  . ibuprofen (ADVIL,MOTRIN) 800 MG tablet Take 1 tablet (800 mg total) by mouth 3 (three) times daily. 21 tablet 0 04/10/2017 at 0500  . Multiple Vitamin (MULTIVITAMIN WITH MINERALS) TABS tablet Take 1 tablet by mouth daily.   04/09/2017 at Unknown time  . PRESCRIPTION MEDICATION Take 1 tablet by mouth daily. Valspar- sample received from MD office- pt not sure of dose   Past Week at Unknown time  . traMADol (ULTRAM) 50 MG tablet Take 50 mg by mouth every 6 (six) hours as needed for moderate pain or severe pain.   Past Week at Unknown time    Review of Systems  Constitutional: Negative.   Gastrointestinal: Positive for  abdominal pain and nausea. Negative for constipation, diarrhea and vomiting.  Genitourinary: Positive for vaginal bleeding. Negative for dyspareunia, dysuria and vaginal discharge.   Physical Exam   Blood pressure (P) 132/65, pulse (!) (P) 56, temperature (P) 98 F (36.7 C), temperature source (P) Oral, resp. rate (P) 18, height 5\' 4"  (1.626 m), weight 189 lb (85.7 kg), last menstrual period 04/09/2017.  Physical Exam  Nursing note and vitals reviewed. Constitutional: She is oriented to person, place, and time. She appears well-developed and well-nourished. She appears distressed.  HENT:  Head: Normocephalic and atraumatic.  Eyes: Conjunctivae are normal. Right eye exhibits no discharge. Left eye exhibits no  discharge. No scleral icterus.  Neck: Normal range of motion.  Respiratory: Effort normal. No respiratory distress.  GI: Soft. Bowel sounds are normal. There is tenderness in the left lower quadrant. There is no rigidity.  Genitourinary: Uterus is enlarged. Uterus is not deviated. Right adnexum displays no mass and no tenderness. Left adnexum displays tenderness. Left adnexum displays no mass.  Neurological: She is alert and oriented to person, place, and time.  Skin: Skin is warm and dry. She is not diaphoretic.  Psychiatric: She has a normal mood and affect. Her behavior is normal. Judgment and thought content normal.    MAU Course  Procedures Results for orders placed or performed during the hospital encounter of 04/10/17 (from the past 24 hour(s))  Urinalysis, Routine w reflex microscopic     Status: Abnormal   Collection Time: 04/10/17  8:49 AM  Result Value Ref Range   Color, Urine YELLOW YELLOW   APPearance CLEAR CLEAR   Specific Gravity, Urine 1.028 1.005 - 1.030   pH 5.0 5.0 - 8.0   Glucose, UA NEGATIVE NEGATIVE mg/dL   Hgb urine dipstick LARGE (A) NEGATIVE   Bilirubin Urine NEGATIVE NEGATIVE   Ketones, ur NEGATIVE NEGATIVE mg/dL   Protein, ur NEGATIVE NEGATIVE mg/dL   Nitrite NEGATIVE NEGATIVE   Leukocytes, UA NEGATIVE NEGATIVE   RBC / HPF TOO NUMEROUS TO COUNT 0 - 5 RBC/hpf   WBC, UA 0-5 0 - 5 WBC/hpf   Bacteria, UA NONE SEEN NONE SEEN   Squamous Epithelial / LPF 0-5 (A) NONE SEEN   Mucus PRESENT   Pregnancy, urine POC     Status: None   Collection Time: 04/10/17  9:01 AM  Result Value Ref Range   Preg Test, Ur NEGATIVE NEGATIVE  CBC     Status: Abnormal   Collection Time: 04/10/17 11:05 AM  Result Value Ref Range   WBC 7.6 4.0 - 10.5 K/uL   RBC 4.32 3.87 - 5.11 MIL/uL   Hemoglobin 10.9 (L) 12.0 - 15.0 g/dL   HCT 34.7 (L) 36.0 - 46.0 %   MCV 80.3 78.0 - 100.0 fL   MCH 25.2 (L) 26.0 - 34.0 pg   MCHC 31.4 30.0 - 36.0 g/dL   RDW 16.0 (H) 11.5 - 15.5 %    Platelets 380 150 - 400 K/uL   US Pelvis Transvanginal Non-ob (tv Only)  Result Date: 04/10/2017 CLINICAL DATA:  Left lower quadrant pain beginning this morning. Fibroids. Endometriosis. EXAM: TRANSABDOMINAL AND TRANSVAGINAL ULTRASOUND OF PELVIS TECHNIQUE: Both transabdominal and transvaginal ultrasound examinations of the pelvis were performed. Transabdominal technique was performed for global imaging of the pelvis including uterus, ovaries, adnexal regions, and pelvic cul-de-sac. It was necessary to proceed with endovaginal exam following the transabdominal exam to visualize the endometrium and ovaries. COMPARISON:  05/03/2015 FINDINGS: Uterus Measurements: 13.2 x 5.6 x 7.0  cm. Retroverted. Intramural fibroid is seen in the anterior fundus measuring 3.5 cm. Second fibroid is seen in the right anterior lower uterine segment measuring 1.7 cm. A third fibroid is subserosal in location in the posterior corpus measuring 1.4 cm. Endometrium Thickness: 8 mm.  No focal abnormality visualized. Right ovary Measurements: 2.1 x 1.8 x 1.7 cm. Normal appearance/no adnexal mass. Left ovary Measurements: 3.6 x 2.4 x 2.0 cm. Normal appearance/no adnexal mass. Other findings No abnormal free fluid. IMPRESSION: Retroverted uterus with 3 fibroids measuring up to 3.5 cm, mildly increased since prior study in 2016. Normal appearance of both ovaries. No adnexal mass or free fluid identified. Electronically Signed   By: Earle Gell M.D.   On: 04/10/2017 12:28   US Pelvis (transabdominal Only)  Result Date: 04/10/2017 CLINICAL DATA:  Left lower quadrant pain beginning this morning. Fibroids. Endometriosis. EXAM: TRANSABDOMINAL AND TRANSVAGINAL ULTRASOUND OF PELVIS TECHNIQUE: Both transabdominal and transvaginal ultrasound examinations of the pelvis were performed. Transabdominal technique was performed for global imaging of the pelvis including uterus, ovaries, adnexal regions, and pelvic cul-de-sac. It was necessary to proceed  with endovaginal exam following the transabdominal exam to visualize the endometrium and ovaries. COMPARISON:  05/03/2015 FINDINGS: Uterus Measurements: 13.2 x 5.6 x 7.0 cm. Retroverted. Intramural fibroid is seen in the anterior fundus measuring 3.5 cm. Second fibroid is seen in the right anterior lower uterine segment measuring 1.7 cm. A third fibroid is subserosal in location in the posterior corpus measuring 1.4 cm. Endometrium Thickness: 8 mm.  No focal abnormality visualized. Right ovary Measurements: 2.1 x 1.8 x 1.7 cm. Normal appearance/no adnexal mass. Left ovary Measurements: 3.6 x 2.4 x 2.0 cm. Normal appearance/no adnexal mass. Other findings No abnormal free fluid. IMPRESSION: Retroverted uterus with 3 fibroids measuring up to 3.5 cm, mildly increased since prior study in 2016. Normal appearance of both ovaries. No adnexal mass or free fluid identified. Electronically Signed   By: Earle Gell M.D.   On: 04/10/2017 12:28     MDM UPT negative CBC Toradol & zofran Ultrasound shows fibroids. No ovarian/adnexal masses.  Patient reports improvement with toradol. Pain 10>5 C/w Dr. Rogue Bussing. Ok to discharge home. Will rx ibuprofen & f/u with Dr. Alwyn Pea.  Assessment and Plan  A: 1. Endometriosis   2. LLQ pain   3. Uterine leiomyoma, unspecified location   4. Urine pregnancy test negative    P: Discharge home Rx ibuprofen 800 mg TID prn -- recommend on schedule for duration of menses -- d/c advil Schedule f/u with Dr. Alwyn Pea for management of recurrent symptoms Rx zofran prn nausea  Jorje Guild 04/10/2017, 10:37 AM

## 2017-04-26 IMAGING — CR DG CHEST 2V
2 series · 2 of 2 positions shown · non-contrast
Comparison: Chest radiograph September 23, 2012

CLINICAL DATA: Productive cough, LEFT chest soreness and dyspnea
for 10 days.

EXAM:
CHEST  2 VIEW

[w chest pa]
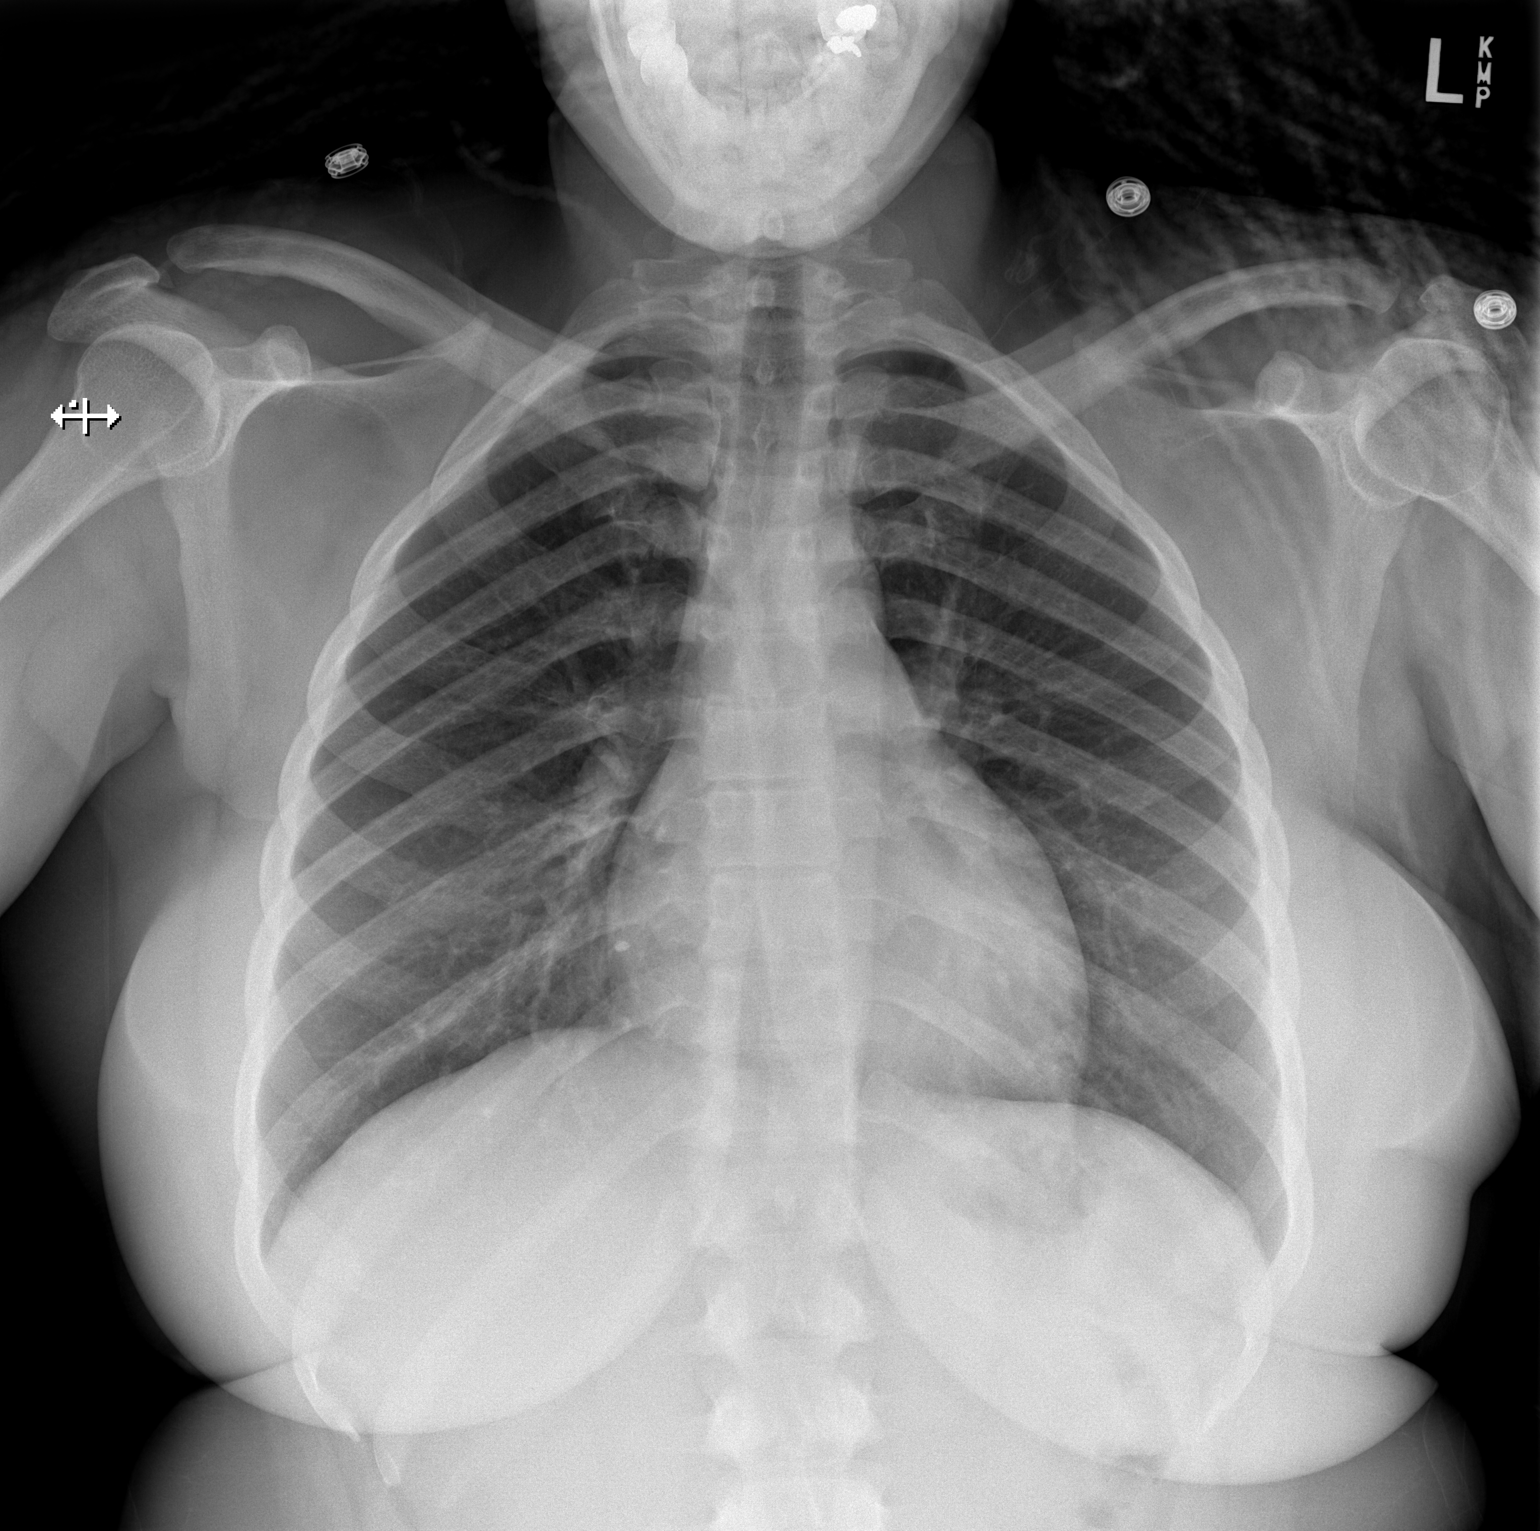

[w chest lat]
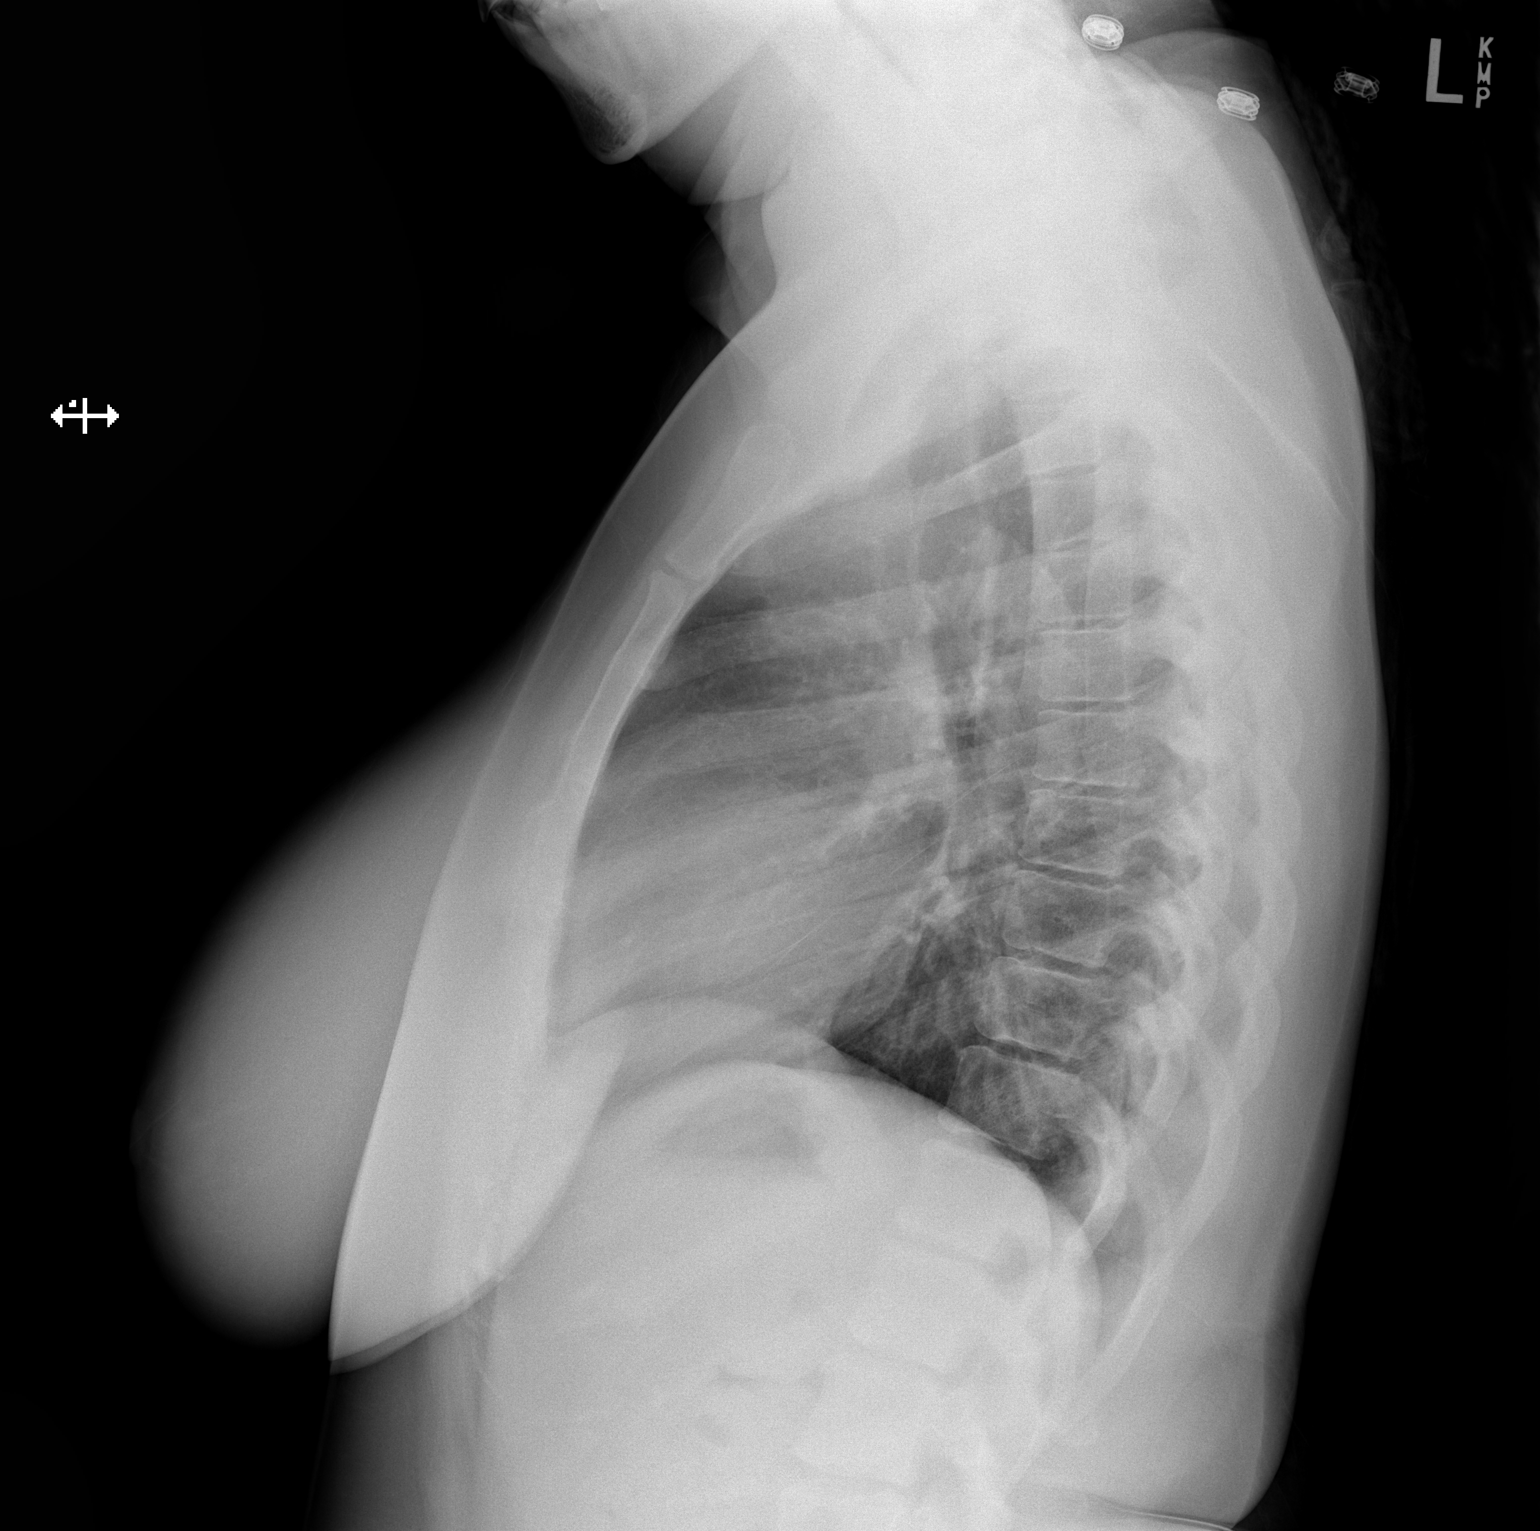

[2 of 2 positions shown; findings below may reference images not displayed]

FINDINGS: Cardiomediastinal silhouette is normal. No pleural effusions or
focal consolidations. Trachea projects midline and there is no
pneumothorax. Soft tissue planes and included osseous structures are
non-suspicious.
IMPRESSION: Normal chest.

## 2017-05-24 ENCOUNTER — Other Ambulatory Visit: Payer: Self-pay | Admitting: Obstetrics & Gynecology

## 2017-05-27 NOTE — Patient Instructions (Addendum)
Your procedure is scheduled on:  Thursday, Nov 15  Enter through the [employer] [facility] at: 6 am  Pick up the phone at the desk and dial [phone].  Call this number if you have problems the morning of surgery: [phone].  Remember: Do NOT eat food or drink clear liquids (including water) after midnight Wednesday.  Take these medicines the morning of surgery with a SIP OF WATER:  None  Bring inhaler with you on day of surgery.  Do Not smoke on the day of surgery.  Stop herbal medications and supplements at this time.  Do NOT wear jewelry (body piercing), metal hair clips/bobby pins, make-up, or nail polish. Do NOT wear lotions, powders, or perfumes.  You may wear deoderant. Do NOT shave for 48 hours prior to surgery. Do NOT bring valuables to the hospital. Contacts, dentures, or bridgework may not be worn into surgery.  Have a responsible adult drive you home and stay with you for 24 hours after your procedure.

## 2017-05-28 ENCOUNTER — Inpatient Hospital Stay (HOSPITAL_COMMUNITY)
Admission: RE | Admit: 2017-05-28 | Discharge: 2017-05-28 | Disposition: A | Discharge status: Home / Self Care | Payer: Self-pay | Source: Ambulatory Visit

## 2017-05-30 ENCOUNTER — Ambulatory Visit (HOSPITAL_COMMUNITY): Admission: RE | Admit: 2017-05-30 | Payer: 59 | Source: Ambulatory Visit | Admitting: Obstetrics & Gynecology

## 2017-05-30 ENCOUNTER — Encounter (HOSPITAL_COMMUNITY): Admission: RE | Payer: Self-pay | Source: Ambulatory Visit

## 2017-05-30 SURGERY — DILATATION AND CURETTAGE /HYSTEROSCOPY
Anesthesia: Choice

## 2017-07-05 ENCOUNTER — Encounter (HOSPITAL_BASED_OUTPATIENT_CLINIC_OR_DEPARTMENT_OTHER): Payer: Self-pay | Admitting: *Deleted

## 2017-07-05 ENCOUNTER — Other Ambulatory Visit: Payer: Self-pay | Admitting: Obstetrics & Gynecology

## 2017-07-12 NOTE — Progress Notes (Signed)
UNABLE TO REACH PT AFTER MULTIPLE ATTEMPTS SINCE 07-05-2017.  CALLED AND SPOKE W/ ADRIENNE, OR SCHEDULE FOR DR Alwyn Pea,  [place] 07-11-2017 VIA PHONE SHE STATED THAT SHE KNEW PT PHONE IS DISCONNECTED AND WILL PAY HER BILL ON Friday 07-12-2017 AND HAVE PHONE TURNED BACK ON.  HAVE TRIED 3 TIMES TODAY , PHONE STILL NOT CONNECTED.  CALLED AND SPOKE [place] VIA PHONE.  SHE STATED SHE HAS NOT BEEN ABLE TO REACH PT EITHER.  BUT SHE STATED THAT PT HAS WI-FI EMAIL THAT ADRIENNE WILL SEND TO PT TODAY WITH INSTRUCTIONS AND ALSO ADRIENNE GAVE ME PT'S EMERGENCY CONTACT NAME AND NUMBER TO RELAY MESSAGE TO.  CALLED AND LM W/ ASHLEY TURNER, PT EMERGENCY CONTACT ON HIPPA AT DR [facility] OFFICE, INSTRUCTED HER TO TELL PT TO BE NPO AFTER MN AND ARRIVE AT 0945.  BRING INSURANCE CARD AND PHOTO ID. ALSO , SHE IS TO HAVE A RESPONSIBLE ADULT 18 YRS OLD OR OLDER FOR DISCHARGE AND MUST HAVE NAME AND CONTACT ON ARRIVAL AT CHECK-IN.  GAVE DIRECTIONS TO FACILITY ALSO.    PT WILL NEED HISTORY DONE AND PROBABLE JUST HG AND URINE PREG.

## 2017-07-15 ENCOUNTER — Ambulatory Visit (HOSPITAL_BASED_OUTPATIENT_CLINIC_OR_DEPARTMENT_OTHER)
Admission: RE | Admit: 2017-07-15 | Discharge: 2017-07-15 | Disposition: A | Discharge status: Home / Self Care | Payer: 59 | Source: Ambulatory Visit | Attending: Obstetrics & Gynecology | Admitting: Obstetrics & Gynecology

## 2017-07-15 ENCOUNTER — Encounter (HOSPITAL_BASED_OUTPATIENT_CLINIC_OR_DEPARTMENT_OTHER): Payer: Self-pay

## 2017-07-15 ENCOUNTER — Ambulatory Visit (HOSPITAL_BASED_OUTPATIENT_CLINIC_OR_DEPARTMENT_OTHER): Payer: 59 | Admitting: Anesthesiology

## 2017-07-15 ENCOUNTER — Encounter (HOSPITAL_BASED_OUTPATIENT_CLINIC_OR_DEPARTMENT_OTHER)
Admission: RE | Disposition: A | Discharge status: Home / Self Care | Payer: Self-pay | Source: Ambulatory Visit | Attending: Obstetrics & Gynecology

## 2017-07-15 DIAGNOSIS — F17290 Nicotine dependence, other tobacco product, uncomplicated: Secondary | ICD-10-CM | POA: Insufficient documentation

## 2017-07-15 DIAGNOSIS — N939 Abnormal uterine and vaginal bleeding, unspecified: Secondary | ICD-10-CM | POA: Diagnosis present

## 2017-07-15 DIAGNOSIS — N840 Polyp of corpus uteri: Secondary | ICD-10-CM | POA: Diagnosis not present

## 2017-07-15 DIAGNOSIS — D649 Anemia, unspecified: Secondary | ICD-10-CM | POA: Diagnosis not present

## 2017-07-15 DIAGNOSIS — Z79899 Other long term (current) drug therapy: Secondary | ICD-10-CM | POA: Insufficient documentation

## 2017-07-15 HISTORY — DX: Generalized anxiety disorder: F41.1

## 2017-07-15 HISTORY — DX: Anemia, unspecified: D64.9

## 2017-07-15 HISTORY — DX: Major depressive disorder, single episode, unspecified: F32.9

## 2017-07-15 HISTORY — DX: Leiomyoma of uterus, unspecified: D25.9

## 2017-07-15 HISTORY — DX: Polyp of corpus uteri: N84.0

## 2017-07-15 HISTORY — DX: Depression, unspecified: F32.A

## 2017-07-15 HISTORY — PX: DILATATION & CURETTAGE/HYSTEROSCOPY WITH MYOSURE: SHX6511

## 2017-07-15 HISTORY — DX: Personal history of other mental and behavioral disorders: Z86.59

## 2017-07-15 LAB — POCT PREGNANCY, URINE: Preg Test, Ur: NEGATIVE

## 2017-07-15 SURGERY — DILATATION & CURETTAGE/HYSTEROSCOPY WITH MYOSURE
Anesthesia: General

## 2017-07-15 MED ORDER — PROPOFOL 10 MG/ML IV BOLUS
INTRAVENOUS | Status: AC
  Filled 2017-07-15: qty 40

## 2017-07-15 MED ORDER — FENTANYL CITRATE (PF) 100 MCG/2ML IJ SOLN
INTRAMUSCULAR | Status: DC | PRN
  Administered 2017-07-15 (×4): 25 ug via INTRAVENOUS

## 2017-07-15 MED ORDER — IBUPROFEN 800 MG PO TABS: 800.0000 mg | ORAL_TABLET | Freq: Three times a day (TID) | ORAL | 3 refills | Status: DC | PRN

## 2017-07-15 MED ORDER — GLYCOPYRROLATE 0.2 MG/ML IV SOSY
PREFILLED_SYRINGE | INTRAVENOUS | Status: AC
  Filled 2017-07-15: qty 5

## 2017-07-15 MED ORDER — SILVER NITRATE-POT NITRATE 75-25 % EX MISC
CUTANEOUS | Status: DC | PRN
  Administered 2017-07-15: 2

## 2017-07-15 MED ORDER — ONDANSETRON HCL 4 MG/2ML IJ SOLN
INTRAMUSCULAR | Status: DC | PRN
  Administered 2017-07-15: 4 mg via INTRAVENOUS

## 2017-07-15 MED ORDER — FENTANYL CITRATE (PF) 100 MCG/2ML IJ SOLN
INTRAMUSCULAR | Status: AC
  Filled 2017-07-15: qty 2

## 2017-07-15 MED ORDER — MEPERIDINE HCL 25 MG/ML IJ SOLN
6.2500 mg | INTRAMUSCULAR | Status: DC | PRN
  Filled 2017-07-15: qty 1

## 2017-07-15 MED ORDER — LIDOCAINE-EPINEPHRINE 1 %-1:100000 IJ SOLN
INTRAMUSCULAR | Status: DC | PRN
  Administered 2017-07-15: 10 mL

## 2017-07-15 MED ORDER — ONDANSETRON HCL 4 MG/2ML IJ SOLN
4.0000 mg | Freq: Once | INTRAMUSCULAR | Status: DC | PRN
  Filled 2017-07-15: qty 2

## 2017-07-15 MED ORDER — LACTATED RINGERS IV SOLN
INTRAVENOUS | Status: DC
  Administered 2017-07-15: 11:00:00 via INTRAVENOUS
  Administered 2017-07-15: 1000 mL via INTRAVENOUS
  Filled 2017-07-15: qty 1000

## 2017-07-15 MED ORDER — PROPOFOL 10 MG/ML IV BOLUS
INTRAVENOUS | Status: DC | PRN
  Administered 2017-07-15: 150 mg via INTRAVENOUS

## 2017-07-15 MED ORDER — HYDROMORPHONE HCL 1 MG/ML IJ SOLN
0.2500 mg | INTRAMUSCULAR | Status: DC | PRN
  Filled 2017-07-15: qty 0.5

## 2017-07-15 MED ORDER — ONDANSETRON HCL 4 MG/2ML IJ SOLN
INTRAMUSCULAR | Status: AC
  Filled 2017-07-15: qty 2

## 2017-07-15 MED ORDER — MIDAZOLAM HCL 5 MG/5ML IJ SOLN
INTRAMUSCULAR | Status: DC | PRN
  Administered 2017-07-15: 2 mg via INTRAVENOUS

## 2017-07-15 MED ORDER — ONDANSETRON HCL 4 MG/2ML IJ SOLN: INTRAMUSCULAR | Status: DC | PRN

## 2017-07-15 MED ORDER — LIDOCAINE 2% (20 MG/ML) 5 ML SYRINGE
INTRAMUSCULAR | Status: AC
  Filled 2017-07-15: qty 5

## 2017-07-15 MED ORDER — LIDOCAINE 2% (20 MG/ML) 5 ML SYRINGE
INTRAMUSCULAR | Status: DC | PRN
  Administered 2017-07-15: 100 mg via INTRAVENOUS

## 2017-07-15 MED ORDER — DEXAMETHASONE SODIUM PHOSPHATE 4 MG/ML IJ SOLN
INTRAMUSCULAR | Status: DC | PRN
  Administered 2017-07-15: 10 mg via INTRAVENOUS

## 2017-07-15 MED ORDER — MIDAZOLAM HCL 2 MG/2ML IJ SOLN
INTRAMUSCULAR | Status: AC
  Filled 2017-07-15: qty 2

## 2017-07-15 MED ORDER — DEXAMETHASONE SODIUM PHOSPHATE 10 MG/ML IJ SOLN
INTRAMUSCULAR | Status: AC
  Filled 2017-07-15: qty 1

## 2017-07-15 MED ORDER — KETOROLAC TROMETHAMINE 30 MG/ML IJ SOLN
INTRAMUSCULAR | Status: DC | PRN
  Administered 2017-07-15: 30 mg via INTRAVENOUS

## 2017-07-15 MED ORDER — HYDROCODONE-ACETAMINOPHEN 5-325 MG PO TABS: 1.0000 | ORAL_TABLET | Freq: Four times a day (QID) | ORAL | 0 refills | Status: DC | PRN

## 2017-07-15 MED ORDER — ONDANSETRON 8 MG PO TBDP: 8.0000 mg | ORAL_TABLET | Freq: Three times a day (TID) | ORAL | 0 refills | Status: AC | PRN

## 2017-07-15 MED ORDER — KETOROLAC TROMETHAMINE 30 MG/ML IJ SOLN
INTRAMUSCULAR | Status: AC
  Filled 2017-07-15: qty 1

## 2017-07-15 MED ORDER — GLYCOPYRROLATE 0.2 MG/ML IV SOSY
PREFILLED_SYRINGE | INTRAVENOUS | Status: DC | PRN
  Administered 2017-07-15: .2 mg via INTRAVENOUS

## 2017-07-15 SURGICAL SUPPLY — 19 items
BIPOLAR CUTTING LOOP 21FR (ELECTRODE)
CANISTER SUCT 3000ML PPV (MISCELLANEOUS) ×2 IMPLANT
CATH ROBINSON RED A/P 16FR (CATHETERS) IMPLANT
DEVICE MYOSURE LITE (MISCELLANEOUS) IMPLANT
DEVICE MYOSURE REACH (MISCELLANEOUS) IMPLANT
DILATOR CANAL MILEX (MISCELLANEOUS) IMPLANT
GLOVE BIO SURGEON STRL SZ 6.5 (GLOVE) ×4 IMPLANT
GOWN STRL REUS W/TWL LRG LVL3 (GOWN DISPOSABLE) ×2 IMPLANT
KIT RM TURNOVER CYSTO AR (KITS) ×2 IMPLANT
LOOP CUTTING BIPOLAR 21FR (ELECTRODE) IMPLANT
MYOSURE XL FIBROID REM (MISCELLANEOUS) ×2
PACK VAGINAL MINOR WOMEN LF (CUSTOM PROCEDURE TRAY) ×2 IMPLANT
PAD OB MATERNITY 4.3X12.25 (PERSONAL CARE ITEMS) ×2 IMPLANT
SEAL ROD LENS SCOPE MYOSURE (ABLATOR) ×2 IMPLANT
SYSTEM TISS REMOVAL MYSR XL RM (MISCELLANEOUS) ×1 IMPLANT
TOWEL OR 17X24 6PK STRL BLUE (TOWEL DISPOSABLE) ×4 IMPLANT
TUBING AQUILEX INFLOW (TUBING) ×2 IMPLANT
TUBING AQUILEX OUTFLOW (TUBING) ×2 IMPLANT
WATER STERILE IRR 500ML POUR (IV SOLUTION) ×2 IMPLANT

## 2017-07-15 NOTE — Anesthesia Procedure Notes (Signed)
Procedure Name: LMA Insertion Date/Time: 07/15/2017 11:38 AM Performed by: Justice Rocher, CRNA Pre-anesthesia Checklist: Patient identified, Emergency Drugs available, Suction available and Patient being monitored Patient Re-evaluated:Patient Re-evaluated prior to induction Oxygen Delivery Method: Circle system utilized Preoxygenation: Pre-oxygenation with 100% oxygen Induction Type: IV induction Ventilation: Mask ventilation without difficulty LMA: LMA inserted LMA Size: 4.0 Number of attempts: 1 Airway Equipment and Method: Bite block Placement Confirmation: positive ETCO2 and breath sounds checked- equal and bilateral Tube secured with: Tape Dental Injury: Teeth and Oropharynx as per pre-operative assessment

## 2017-07-15 NOTE — Transfer of Care (Signed)
Immediate Anesthesia Transfer of Care Note  Patient: Danielle Dawson  Procedure(s) Performed: Procedure(s) (LRB): DILATATION & CURETTAGE/HYSTEROSCOPY WITH MYOSURE (N/A)  Patient Location: PACU  Anesthesia Type: General  Level of Consciousness: awake, sedated, patient cooperative and responds to stimulation  Airway & Oxygen Therapy: Patient Spontanous Breathing and Patient connected to [place] oxygen  Post-op Assessment: Report given to PACU RN, Post -op Vital signs reviewed and stable and Patient moving all extremities  Post vital signs: Reviewed and stable  Complications: No apparent anesthesia complications

## 2017-07-15 NOTE — Anesthesia Postprocedure Evaluation (Signed)
Anesthesia Post Note  Patient: Danielle Dawson  Procedure(s) Performed: DILATATION & CURETTAGE/HYSTEROSCOPY WITH MYOSURE (N/A )     Patient location during evaluation: PACU Anesthesia Type: General Level of consciousness: awake and alert Pain management: pain level controlled Vital Signs Assessment: post-procedure vital signs reviewed and stable Respiratory status: spontaneous breathing, nonlabored ventilation, respiratory function stable and patient connected to nasal cannula oxygen Cardiovascular status: blood pressure returned to baseline and stable Postop Assessment: no apparent nausea or vomiting Anesthetic complications: no    Last Vitals:  Vitals:   07/15/17 1314 07/15/17 1408  BP:  139/84  Pulse: (!) 54 (!) 44  Resp: 16 16  Temp:  36.8 C  SpO2: 99% 100%    Last Pain:  Vitals:   07/15/17 1408  TempSrc: Oral                 Calea Hribar DAVID

## 2017-07-15 NOTE — H&P (Signed)
Danielle Dawson is an 36 y.o. female P1 with h/o uterine fibroids and abnormal uterine bleeding Presents for scheduled D&C hysteroscopy. Patient desires fertility via IUI, so cavity clearance is Needed as a polyp was seen on pelvic ultrasound  Pertinent Gynecological History: Menses: flow is excessive with use of 7 pads or tampons on heaviest days and with severe dysmenorrhea Bleeding: dysfunctional uterine bleeding Contraception: none DES exposure: denies Blood transfusions: none Sexually transmitted diseases: past history: Trich Previous GYN Procedures: DNC  Last mammogram: normal Date: 2016 Last pap: normal Date: 2018 OB History: G1, P1   Menstrual History: Menarche age: 18 No LMP recorded.    Past Medical History:  Diagnosis Date  . Anemia   . Endometrial polyp   . Endometriosis   . GAD (generalized anxiety disorder)   . History of panic attacks   . Uterine fibroid     Past Surgical History:  Procedure Laterality Date  . HYSTEROSCOPY W/D&C N/A 10/08/2014   Procedure: HYSTEROSCOPY/POLYPECTOMY/SUCTION DILATATION AND CURETTAGE WITH POST OP VAGINAL EXAM;  Surgeon: Governor Specking, MD;  Location: [facility];  Service: Gynecology;  Laterality: N/A;    Family History  Problem Relation Age of Onset  . Diabetes Maternal Grandfather   . Hypertension Maternal Grandfather   . Hypertension Mother   . Drug abuse Mother        recovery since pt 6y/o  . Heart disease Father   . Diabetes Maternal Grandmother   . Vision loss Maternal Grandmother   . Alcohol abuse Cousin   . Drug abuse Cousin   . Drug abuse Maternal Aunt   . Alcohol abuse Maternal Aunt   . Alcohol abuse Sister   . Depression Maternal Aunt   . Alcohol abuse Maternal Aunt   . Anxiety disorder Maternal Aunt   . Drug abuse Maternal Aunt   . Alcohol abuse Maternal Aunt     Social History:  reports that she has been smoking cigars.  she has never used smokeless tobacco. She reports that she does not drink alcohol or  use drugs.  Allergies:  Allergies  Allergen Reactions  . Shellfish Allergy Itching  . Vraylar [Cariprazine Hcl] Hives and Itching  . Wellbutrin [Bupropion] Hives and Itching    Medications Prior to Admission  Medication Sig Dispense Refill Last Dose  . ALPRAZolam (XANAX) 0.5 MG tablet Take 0.5 mg by mouth 2 (two) times daily as needed for anxiety.   Past Month at Unknown time  . ibuprofen (ADVIL,MOTRIN) 800 MG tablet Take 1 tablet (800 mg total) by mouth 3 (three) times daily with meals as needed for headache or moderate pain. 30 tablet 3   . Multiple Vitamin (MULTIVITAMIN WITH MINERALS) TABS tablet Take 1 tablet by mouth daily.   04/09/2017 at Unknown time  . ondansetron (ZOFRAN ODT) 4 MG disintegrating tablet Take 1 tablet (4 mg total) by mouth every 8 (eight) hours as needed for nausea or vomiting. 15 tablet 0   . QUEtiapine (SEROQUEL) 25 MG tablet Take 25 mg at bedtime as needed by mouth (sleep).     . traMADol (ULTRAM) 50 MG tablet Take 50 mg every 6 (six) hours as needed by mouth for moderate pain.     . vitamin B-12 (CYANOCOBALAMIN) 1000 MCG tablet Take 1,000 mcg daily by mouth.       ROS as per HPI  Blood pressure 112/69, pulse (!) 58, temperature 98.4 F (36.9 C), temperature source Oral, resp. rate 16, weight 84.3 kg (185 lb 14.4 oz), SpO2  100 %. Physical Exam Deferred to OR  No results found for this or any previous visit (from the past 24 hour(s)).  No results found.  Assessment/Plan: 36 yo with endometrial polyp and abnormal uterine bleeding On call to OR for D&C hysteroscopy / polypectomy for cavity clearance Discussed R/B/A/I for procedure and patient voiced understanding.  She agrees to proceed and consent signed and placed into chart.  Myrla Malanowski, [place] 07/15/2017, 10:23 AM

## 2017-07-15 NOTE — Op Note (Signed)
OPERATIVE REPORT  Danielle Dawson female 36 y.o. 07/15/2017   Pre-operative Diagnosis: 1. Abnormal uterine bleeding 2. Endometrial polyp  Post-operative Diagnosis: same  Surgeon: Surgeon(s) and Role:    Sanjuana Kava, MD - Primary   Assistant:  None  Procedure: Dilatation and curettage, diagnostic hysteroscopy, polypectomy  Anesthesia: General LMA anesthesia and Local anesthesia 1% plain lidocaine, with epinephrine  ASA Class: 2  EBL: 30 mL  UOP: 4mL  IVF: 900 mL LR  Specimen: 1. Cervical biopsy 6 o'clock 2. Endometrial curettings 3. Endometrial polyps  Findings: EUA: External vulva normal vaginal vault: normal Cervix: parous, white patch at 6 o'clock Uterus: Enlarged 14 weeks size, retroverted, palpable fibroids, adnexa: no palpable masses Hysteroscopy: Endometrial polyps   Complications: None  Procedure: Patient was placed in dorsal lithotomy position. After adequate anesthesia was achieved, the patient was prepped and draped in the usual sterile fashion. Time out was performed.  The Graves operative speculum was placed in the vagina and the cervix stabilized with a single-tooth tenaculum.  A paracervical block was performed with 10 mL of 1%  Lidocaine with 1:100000 epinephrine. A cervical biopsy was performed at 6 o'clock where the white patch was visualized.  The uterus was sounded to 8 cm. The  cervix was dilated with Pratt dilators to 10 Fr.  The hysteroscope was passed inside the endometrial cavity; Normal saline was used as a distending medium. The above findings were noted, polyp forceps were used to remove the endometrial polyps. Then sharp curettage was then performed and uterine curettings sent to pathology.  All instruments were removed from the vagina. There was oozing at the tenaculum sites and this was alleviated by silver nitrate sticks.  Counts were correct.  The patient tolerated the procedure well and was transferred to the recovery room in good  condition.  Danielle Dawson Danielle Dawson

## 2017-07-15 NOTE — Anesthesia Preprocedure Evaluation (Signed)
Anesthesia Evaluation  Patient identified by MRN, date of birth, ID band Patient awake    Reviewed: Allergy & Precautions, NPO status , Patient's Chart, lab work & pertinent test results  Airway Mallampati: I  TM Distance: >3 FB Neck ROM: Full    Dental   Pulmonary Current Smoker,    Pulmonary exam normal        Cardiovascular Normal cardiovascular exam     Neuro/Psych Anxiety    GI/Hepatic   Endo/Other    Renal/GU      Musculoskeletal   Abdominal   Peds  Hematology   Anesthesia Other Findings   Reproductive/Obstetrics                             Anesthesia Physical Anesthesia Plan  ASA: II  Anesthesia Plan: General   Post-op Pain Management:    Induction: Intravenous  PONV Risk Score and Plan: 2 and Ondansetron and Midazolam  Airway Management Planned: LMA  Additional Equipment:   Intra-op Plan:   Post-operative Plan: Extubation in OR  Informed Consent: I have reviewed the patients History and Physical, chart, labs and discussed the procedure including the risks, benefits and alternatives for the proposed anesthesia with the patient or authorized representative who has indicated his/her understanding and acceptance.     Plan Discussed with: CRNA and Surgeon  Anesthesia Plan Comments:         Anesthesia Quick Evaluation

## 2017-07-15 NOTE — Discharge Instructions (Signed)

## 2017-07-17 ENCOUNTER — Encounter (HOSPITAL_BASED_OUTPATIENT_CLINIC_OR_DEPARTMENT_OTHER): Payer: Self-pay | Admitting: Obstetrics & Gynecology

## 2017-07-17 LAB — POCT HEMOGLOBIN-HEMACUE: HEMOGLOBIN: 10.8 g/dL — AB (ref 12.0–15.0)

## 2017-08-21 ENCOUNTER — Inpatient Hospital Stay (HOSPITAL_COMMUNITY)
Admission: AD | Admit: 2017-08-21 | Discharge: 2017-08-22 | Disposition: A | Discharge status: Home / Self Care | Payer: 59 | Source: Ambulatory Visit | Attending: Obstetrics and Gynecology | Admitting: Obstetrics and Gynecology

## 2017-08-21 ENCOUNTER — Encounter (HOSPITAL_COMMUNITY): Payer: Self-pay

## 2017-08-21 DIAGNOSIS — N809 Endometriosis, unspecified: Secondary | ICD-10-CM

## 2017-08-21 DIAGNOSIS — N946 Dysmenorrhea, unspecified: Secondary | ICD-10-CM | POA: Diagnosis not present

## 2017-08-21 DIAGNOSIS — R109 Unspecified abdominal pain: Secondary | ICD-10-CM | POA: Diagnosis present

## 2017-08-21 DIAGNOSIS — F17290 Nicotine dependence, other tobacco product, uncomplicated: Secondary | ICD-10-CM | POA: Insufficient documentation

## 2017-08-21 DIAGNOSIS — Z9889 Other specified postprocedural states: Secondary | ICD-10-CM | POA: Diagnosis not present

## 2017-08-21 DIAGNOSIS — Z79899 Other long term (current) drug therapy: Secondary | ICD-10-CM | POA: Diagnosis not present

## 2017-08-21 MED ORDER — KETOROLAC TROMETHAMINE 60 MG/2ML IM SOLN
60.0000 mg | Freq: Once | INTRAMUSCULAR | Status: AC
  Administered 2017-08-21: 60 mg via INTRAMUSCULAR
  Filled 2017-08-21: qty 2

## 2017-08-21 NOTE — MAU Note (Signed)
Pt here with severe pain from endometriosis. Had a D&C and polypectomy 07/15/17. Currently on period. Took Tramadol and Ibuprofen about 1730, didn't help.

## 2017-08-21 NOTE — MAU Note (Signed)
Left lower abdominal pain-started getting bad around 1730.  Took tramadol & ibuprofen 800 mg with no relief.  Reports VB since yesterday.  Hx of endometriosis

## 2017-08-22 NOTE — MAU Provider Note (Signed)
History     CSN: [id number]  Arrival date and time: 08/21/17 2120   First Provider Initiated Contact with Patient 08/21/17 2306      Chief Complaint  Patient presents with  . Abdominal Pain   rn note: Pt here with severe pain from endometriosis. Had a D&C and polypectomy 07/15/17. Currently on period. Took Tramadol and Ibuprofen about 1730, didn't help Seen initially by Hansel Feinstein, CNM because she thought that the patient was under Dr. Malachi Carl patient.      Past Medical History:  Diagnosis Date  . Anemia   . Depression   . Endometrial polyp   . Endometriosis   . GAD (generalized anxiety disorder)   . History of panic attacks   . Uterine fibroid     Past Surgical History:  Procedure Laterality Date  . DILATATION & CURETTAGE/HYSTEROSCOPY WITH MYOSURE N/A 07/15/2017   Procedure: DILATATION & CURETTAGE/HYSTEROSCOPY WITH MYOSURE;  Surgeon: Sanjuana Kava, MD;  Location: [facility];  Service: Gynecology;  Laterality: N/A;  . HYSTEROSCOPY W/D&C N/A 10/08/2014   Procedure: HYSTEROSCOPY/POLYPECTOMY/SUCTION DILATATION AND CURETTAGE WITH POST OP VAGINAL EXAM;  Surgeon: Governor Specking, MD;  Location: [facility];  Service: Gynecology;  Laterality: N/A;    Family History  Problem Relation Age of Onset  . Diabetes Maternal Grandfather   . Hypertension Maternal Grandfather   . Hypertension Mother   . Drug abuse Mother        recovery since pt 6y/o  . Heart disease Father   . Diabetes Maternal Grandmother   . Vision loss Maternal Grandmother   . Alcohol abuse Cousin   . Drug abuse Cousin   . Drug abuse Maternal Aunt   . Alcohol abuse Maternal Aunt   . Alcohol abuse Sister   . Depression Maternal Aunt   . Alcohol abuse Maternal Aunt   . Anxiety disorder Maternal Aunt   . Drug abuse Maternal Aunt   . Alcohol abuse Maternal Aunt     Social History   Tobacco Use  . Smoking status: Current Some Day Smoker    Types: Cigars    Last attempt to quit: 06/01/2012     Years since quitting: 5.2  . Smokeless tobacco: Never Used  Substance Use Topics  . Alcohol use: No    Comment: socially  . Drug use: No    Allergies:  Allergies  Allergen Reactions  . Shellfish Allergy Itching  . Vraylar [Cariprazine Hcl] Hives and Itching  . Wellbutrin [Bupropion] Hives and Itching    Medications Prior to Admission  Medication Sig Dispense Refill Last Dose  . ALPRAZolam (XANAX) 0.5 MG tablet Take 0.5 mg by mouth 2 (two) times daily as needed for anxiety.   Past Month at Unknown time  . HYDROcodone-acetaminophen (NORCO/VICODIN) 5-325 MG tablet Take 1-2 tablets by mouth every 6 (six) hours as needed for moderate pain. 30 tablet 0   . ibuprofen (ADVIL,MOTRIN) 800 MG tablet Take 1 tablet (800 mg total) by mouth every 8 (eight) hours as needed for headache, moderate pain or cramping. 60 tablet 3   . Multiple Vitamin (MULTIVITAMIN WITH MINERALS) TABS tablet Take 1 tablet by mouth daily.   07/14/2017 at Unknown time  . ondansetron (ZOFRAN-ODT) 8 MG disintegrating tablet Take 1 tablet (8 mg total) by mouth every 8 (eight) hours as needed for nausea or vomiting. 21 tablet 0   . QUEtiapine (SEROQUEL) 25 MG tablet Take 25 mg at bedtime as needed by mouth (sleep).   Unknown at Unknown time  .  vitamin B-12 (CYANOCOBALAMIN) 1000 MCG tablet Take 1,000 mcg daily by mouth.   07/14/2017 at Unknown time    Review of Systems  Genitourinary: Positive for pelvic pain.   Physical Exam   Blood pressure 121/73, pulse 71, temperature 97.9 F (36.6 C), temperature source Oral, resp. rate 20, height 5\' 4"  (1.626 m), weight 182 lb (82.6 kg), SpO2 99 %.  Physical Exam  Nursing note and vitals reviewed. Constitutional: She is oriented to person, place, and time. She appears well-developed and well-nourished.  HENT:  Head: Normocephalic and atraumatic.  Cardiovascular: Normal rate and regular rhythm.  Respiratory: Effort normal and breath sounds normal.  GI: Soft.  Genitourinary:  Vagina normal.  Genitourinary Comments: Tender in left lower quadrant  Musculoskeletal: Normal range of motion.  Neurological: She is alert and oriented to person, place, and time.  Skin: Skin is warm and dry.  Psychiatric: She has a normal mood and affect. Her behavior is normal. Judgment and thought content normal.    MAU Course  Procedures  MDM Pelvic Exam per M. Williams and pt given a shot of Toradol and has gotten a lot of relief.  Assessment and Plan  Endometriosis Dysmennorhea  Discharge to home Follow up With Dr Alwyn Pea as needed  [name]  [place] 08/22/2017, 12:13 AM

## 2017-08-22 NOTE — Discharge Instructions (Signed)
Endometriosis Endometriosis is a condition in which the tissue that lines the uterus (endometrium) grows outside of its normal location. The tissue may grow in many locations close to the uterus, but it commonly grows on the ovaries, fallopian tubes, vagina, or bowel. When the uterus sheds the endometrium every menstrual cycle, there is bleeding wherever the endometrial tissue is located. This can cause pain because blood is irritating to tissues that are not normally exposed to it. What are the causes? The cause of endometriosis is not known. What increases the risk? You may be more likely to develop endometriosis if you:  Have a family history of endometriosis.  Have never given birth.  Started your period at age 28 or younger.  Have high levels of estrogen in your body.  Were exposed to a certain medicine (diethylstilbestrol) before you were born (in utero).  Had low birth weight.  Were born as a twin, triplet, or other multiple.  Have a BMI of less than 25. BMI is an estimate of body fat and is calculated from height and weight.  What are the signs or symptoms? Often, there are no symptoms of this condition. If you do have symptoms, they may:  Vary depending on where your endometrial tissue is growing.  Occur during your menstrual period (most common) or midcycle.  Come and go, or you may go months with no symptoms at all.  Stop with menopause.  Symptoms may include:  Pain in the back or abdomen.  Heavier bleeding during periods.  Pain during sex.  Painful bowel movements.  Infertility.  Pelvic pain.  Bleeding more than once a month.  How is this diagnosed? This condition is diagnosed based on your symptoms and a physical exam. You may have tests, such as:  Blood tests and urine tests. These may be done to help rule out other possible causes of your symptoms.  Ultrasound, to look for abnormal tissues.  An X-ray of the lower bowel (barium enema).  An  ultrasound that is done through the vagina (transvaginally).  CT scan.  MRI.  Laparoscopy. In this procedure, a lighted, pencil-sized instrument called a laparoscope is inserted into your abdomen through an incision. The laparoscope allows your health care provider to look at the organs inside your body and check for abnormal tissue to confirm the diagnosis. If abnormal tissue is found, your health care provider may remove a small piece of tissue (biopsy) to be examined under a microscope.  How is this treated? Treatment for this condition may include:  Medicines to relieve pain, such as NSAIDs.  Hormone therapy. This involves using artificial (synthetic) hormones to reduce endometrial tissue growth. Your health care provider may recommend using a hormonal form of birth control, or other medicines.  Surgery. This may be done to remove abnormal endometrial tissue. ? In some cases, tissue may be removed using a laparoscope and a laser (laparoscopic laser treatment). ? In severe cases, surgery may be done to remove the fallopian tubes, uterus, and ovaries (hysterectomy).  Follow these instructions at home:  Take over-the-counter and prescription medicines only as told by your health care provider.  Do not drive or use heavy machinery while taking prescription pain medicine.  Try to avoid activities that cause pain, including sexual activity.  Keep all follow-up visits as told by your health care provider. This is important. Contact a health care provider if:  You have pain in the area between your hip bones (pelvic area) that occurs: ? Before, during, or  after your period. ? In between your period and gets worse during your period. ? During or after sex. ? With bowel movements or urination, especially during your period.  You have problems getting pregnant.  You have a fever. Get help right away if:  You have severe pain that does not get better with medicine.  You have severe  nausea and vomiting, or you cannot eat without vomiting.  You have pain that affects only the lower, right side of your abdomen.  You have abdominal pain that gets worse.  You have abdominal swelling.  You have blood in your stool. This information is not intended to replace advice given to you by your health care provider. Make sure you discuss any questions you have with your health care provider. Document Released: 06/29/2000 Document Revised: 04/06/2016 Document Reviewed: 12/03/2015 Elsevier Interactive Patient Education  2018 [employer].   Pelvic Pain, Female Pelvic pain is pain in your lower abdomen, below your belly button and between your hips. The pain may start suddenly (acute), keep coming back (recurring), or last a long time (chronic). Pelvic pain that lasts longer than six months is considered chronic. Pelvic pain may affect your:  Reproductive organs.  Urinary system.  Digestive tract.  Musculoskeletal system.  There are many potential causes of pelvic pain. Sometimes, the pain can be a result of digestive or urinary conditions, strained muscles or ligaments, or even reproductive conditions. Sometimes the cause of pelvic pain is not known. Follow these instructions at home:  Take over-the-counter and prescription medicines only as told by your health care provider.  Rest as told by your health care provider.  Do not have sex it if hurts.  Keep a journal of your pelvic pain. Write down: ? When the pain started. ? Where the pain is located. ? What seems to make the pain better or worse, such as food or your menstrual cycle. ? Any symptoms you have along with the pain.  Keep all follow-up visits as told by your health care provider. This is important. Contact a health care provider if:  Medicine does not help your pain.  Your pain comes back.  You have new symptoms.  You have abnormal vaginal discharge or bleeding, including bleeding after  menopause.  You have a fever or chills.  You are constipated.  You have blood in your urine or stool.  You have foul-smelling urine.  You feel weak or lightheaded. Get help right away if:  You have sudden severe pain.  Your pain gets steadily worse.  You have severe pain along with fever, nausea, vomiting, or excessive sweating.  You lose consciousness. This information is not intended to replace advice given to you by your health care provider. Make sure you discuss any questions you have with your health care provider. Document Released: 05/29/2004 Document Revised: 07/27/2015 Document Reviewed: 04/22/2015 Elsevier Interactive Patient Education  2018 [employer].

## 2018-05-28 ENCOUNTER — Other Ambulatory Visit: Payer: Self-pay

## 2018-05-28 ENCOUNTER — Inpatient Hospital Stay (HOSPITAL_COMMUNITY)
Admission: AD | Admit: 2018-05-28 | Discharge: 2018-05-28 | Discharge status: Against Medical Advice | Payer: 59 | Source: Ambulatory Visit | Attending: Obstetrics & Gynecology | Admitting: Obstetrics & Gynecology

## 2018-05-28 DIAGNOSIS — N644 Mastodynia: Secondary | ICD-10-CM | POA: Insufficient documentation

## 2018-05-28 DIAGNOSIS — Z5321 Procedure and treatment not carried out due to patient leaving prior to being seen by health care provider: Secondary | ICD-10-CM | POA: Diagnosis not present

## 2018-05-28 LAB — URINALYSIS, ROUTINE W REFLEX MICROSCOPIC
Bilirubin Urine: NEGATIVE
Glucose, UA: NEGATIVE mg/dL
HGB URINE DIPSTICK: NEGATIVE
KETONES UR: NEGATIVE mg/dL
LEUKOCYTES UA: NEGATIVE
Nitrite: NEGATIVE
PROTEIN: NEGATIVE mg/dL
Specific Gravity, Urine: 1.012 (ref 1.005–1.030)
pH: 6 (ref 5.0–8.0)

## 2018-05-28 LAB — POCT PREGNANCY, URINE: PREG TEST UR: NEGATIVE

## 2018-05-28 NOTE — MAU Note (Signed)
Pt signed out AMA without being seen by a provider, but after triage complete.

## 2018-05-28 NOTE — MAU Note (Signed)
Pt presents to MAU with complaints of pain in her left breast that started Sunday.

## 2020-01-26 ENCOUNTER — Encounter (HOSPITAL_COMMUNITY): Payer: Self-pay

## 2020-01-26 ENCOUNTER — Emergency Department (HOSPITAL_COMMUNITY)
Admission: EM | Admit: 2020-01-26 | Discharge: 2020-01-27 | Disposition: A | Discharge status: Home / Self Care | Payer: Self-pay | Attending: Emergency Medicine | Admitting: Emergency Medicine

## 2020-01-26 ENCOUNTER — Other Ambulatory Visit: Payer: Self-pay

## 2020-01-26 ENCOUNTER — Emergency Department (HOSPITAL_COMMUNITY): Payer: Self-pay

## 2020-01-26 DIAGNOSIS — K5900 Constipation, unspecified: Secondary | ICD-10-CM | POA: Insufficient documentation

## 2020-01-26 DIAGNOSIS — R1030 Lower abdominal pain, unspecified: Secondary | ICD-10-CM

## 2020-01-26 DIAGNOSIS — D259 Leiomyoma of uterus, unspecified: Secondary | ICD-10-CM | POA: Insufficient documentation

## 2020-01-26 DIAGNOSIS — F17210 Nicotine dependence, cigarettes, uncomplicated: Secondary | ICD-10-CM | POA: Insufficient documentation

## 2020-01-26 LAB — CBC
HCT: 37.5 % (ref 36.0–46.0)
Hemoglobin: 11.8 g/dL — ABNORMAL LOW (ref 12.0–15.0)
MCH: 26.1 pg (ref 26.0–34.0)
MCHC: 31.5 g/dL (ref 30.0–36.0)
MCV: 83 fL (ref 80.0–100.0)
Platelets: 332 10*3/uL (ref 150–400)
RBC: 4.52 MIL/uL (ref 3.87–5.11)
RDW: 16.4 % — ABNORMAL HIGH (ref 11.5–15.5)
WBC: 6.3 10*3/uL (ref 4.0–10.5)
nRBC: 0 % (ref 0.0–0.2)

## 2020-01-26 LAB — COMPREHENSIVE METABOLIC PANEL
ALT: 13 U/L (ref 0–44)
AST: 14 U/L — ABNORMAL LOW (ref 15–41)
Albumin: 3.6 g/dL (ref 3.5–5.0)
Alkaline Phosphatase: 66 U/L (ref 38–126)
Anion gap: 9 (ref 5–15)
BUN: 11 mg/dL (ref 6–20)
CO2: 25 mmol/L (ref 22–32)
Calcium: 8.5 mg/dL — ABNORMAL LOW (ref 8.9–10.3)
Chloride: 105 mmol/L (ref 98–111)
Creatinine, Ser: 0.85 mg/dL (ref 0.44–1.00)
GFR calc Af Amer: >60 mL/min (ref >60)
GFR calc non Af Amer: >60 mL/min (ref >60)
Glucose, Bld: 92 mg/dL (ref 70–99)
Potassium: 4.4 mmol/L (ref 3.5–5.1)
Sodium: 139 mmol/L (ref 135–145)
Total Bilirubin: 0.2 mg/dL — ABNORMAL LOW (ref 0.3–1.2)
Total Protein: 7.4 g/dL (ref 6.5–8.1)

## 2020-01-26 LAB — I-STAT BETA HCG BLOOD, ED (MC, WL, AP ONLY): I-stat hCG, quantitative: <5 m[IU]/mL (ref <5)

## 2020-01-26 LAB — LIPASE, BLOOD: Lipase: 30 U/L (ref 11–51)

## 2020-01-26 MED ORDER — SODIUM CHLORIDE 0.9% FLUSH
3.0000 mL | Freq: Once | INTRAVENOUS | Status: AC
  Administered 2020-01-26: 23:00:00 3 mL via INTRAVENOUS

## 2020-01-26 MED ORDER — IOHEXOL 300 MG/ML  SOLN
100.0000 mL | Freq: Once | INTRAMUSCULAR | Status: AC | PRN
  Administered 2020-01-26: 100 mL via INTRAVENOUS

## 2020-01-26 MED ORDER — HYDROMORPHONE HCL 1 MG/ML IJ SOLN
1.0000 mg | Freq: Once | INTRAMUSCULAR | Status: AC
  Administered 2020-01-26: 23:00:00 1 mg via INTRAVENOUS
  Filled 2020-01-26: qty 1

## 2020-01-26 MED ORDER — SODIUM CHLORIDE 0.9 % IV BOLUS
1000.0000 mL | Freq: Once | INTRAVENOUS | Status: AC
  Administered 2020-01-26: 23:00:00 1000 mL via INTRAVENOUS

## 2020-01-26 MED ORDER — ONDANSETRON HCL 4 MG/2ML IJ SOLN
4.0000 mg | Freq: Once | INTRAMUSCULAR | Status: AC
  Administered 2020-01-26: 23:00:00 4 mg via INTRAVENOUS
  Filled 2020-01-26: qty 2

## 2020-01-26 NOTE — ED Notes (Signed)
Called for pt in ED lobby for labs, n/a x1. [employer]

## 2020-01-26 NOTE — ED Triage Notes (Signed)
Patient c/o abdominal pain and nausea x 5 days. Patient states the pain has been worsening.

## 2020-01-26 NOTE — ED Notes (Addendum)
Pt called for rooming x1. No answer!! 

## 2020-01-26 NOTE — ED Provider Notes (Signed)
[facility] DEPT Provider Note   CSN: [id number] Arrival date & time: 01/26/20  1846     History Chief Complaint  Patient presents with  . Abdominal Pain  . Nausea    Danielle Dawson is a 39 y.o. female.  39 year old female presents with complaint of lower abdominal pain onset Saturday, progressively worsening, located across umbilical area and lower abdomen.  Pain is worse with sitting, described as a pressure feeling also reports nausea, dysuria.  Denies associated vomiting, fevers, changes in bowel habits or vaginal discharge. Reports history of endometriosis and known fibroids, reports prior laparoscopy for her endometriosis, also D&C at that time for her fibroids.        Past Medical History:  Diagnosis Date  . Anemia   . Depression   . Endometrial polyp   . Endometriosis   . GAD (generalized anxiety disorder)   . History of panic attacks   . Uterine fibroid     Patient Active Problem List   Diagnosis Date Noted  . GAD (generalized anxiety disorder) 07/21/2015  . Leiomyoma of uterus, unspecified 03/19/2014  . 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 Routine gynecological examination 11/06/2012    Past Surgical History:  Procedure Laterality Date  . DILATATION & CURETTAGE/HYSTEROSCOPY WITH MYOSURE N/A 07/15/2017   Procedure: DILATATION & CURETTAGE/HYSTEROSCOPY WITH MYOSURE;  Surgeon: Sanjuana Kava, MD;  Location: [facility];  Service: Gynecology;  Laterality: N/A;  . HYSTEROSCOPY WITH D & C N/A 10/08/2014   Procedure: HYSTEROSCOPY/POLYPECTOMY/SUCTION DILATATION AND CURETTAGE WITH POST OP VAGINAL EXAM;  Surgeon: Governor Specking, MD;  Location: [facility];  Service: Gynecology;  Laterality: N/A;     OB History    Gravida  1   Para  1   Term  1   Preterm      AB      Living  1     SAB      TAB      Ectopic      Multiple      Live Births              Family  History  Problem Relation Age of Onset  . Diabetes Maternal Grandfather   . Hypertension Maternal Grandfather   . Hypertension Mother   . Drug abuse Mother        recovery since pt 6y/o  . Heart disease Father   . Diabetes Maternal Grandmother   . Vision loss Maternal Grandmother   . Alcohol abuse Cousin   . Drug abuse Cousin   . Drug abuse Maternal Aunt   . Alcohol abuse Maternal Aunt   . Alcohol abuse Sister   . Depression Maternal Aunt   . Alcohol abuse Maternal Aunt   . Anxiety disorder Maternal Aunt   . Drug abuse Maternal Aunt   . Alcohol abuse Maternal Aunt     Social History   Tobacco Use  . Smoking status: Current Some Day Smoker    Types: Cigars    Last attempt to quit: 06/01/2012    Years since quitting: 7.6  . Smokeless tobacco: Never Used  Vaping Use  . Vaping Use: Never used  Substance Use Topics  . Alcohol use: Yes    Comment: socially  . Drug use: No    Home Medications Prior to Admission medications   Medication Sig Start Date End Date Taking? Authorizing Provider  ALPRAZolam Duanne Moron) 0.5 MG tablet Take 0.5 mg by  mouth 2 (two) times daily as needed for anxiety.    [provider]  docusate sodium (COLACE) 100 MG capsule Take 1 capsule (100 mg total) by mouth 2 (two) times daily as needed for mild constipation. 01/27/20   Tacy Learn, PA-C  HYDROcodone-acetaminophen (NORCO/VICODIN) 5-325 MG tablet Take 1-2 tablets by mouth every 6 (six) hours as needed for moderate pain. 07/15/17   Sanjuana Kava, MD  meloxicam (MOBIC) 7.5 MG tablet Take 1 tablet (7.5 mg total) by mouth daily. 01/27/20   Tacy Learn, PA-C  Multiple Vitamin (MULTIVITAMIN WITH MINERALS) TABS tablet Take 1 tablet by mouth daily.    [provider]  ondansetron (ZOFRAN-ODT) 8 MG disintegrating tablet Take 1 tablet (8 mg total) by mouth every 8 (eight) hours as needed for nausea or vomiting. 07/15/17   Sanjuana Kava, MD  QUEtiapine (SEROQUEL) 25 MG tablet Take 25 mg at  bedtime as needed by mouth (sleep).    [provider]  vitamin B-12 (CYANOCOBALAMIN) 1000 MCG tablet Take 1,000 mcg daily by mouth.    [provider]    Allergies    Shellfish allergy, Vraylar [cariprazine hcl], and Wellbutrin [bupropion]  Review of Systems   Review of Systems  Constitutional: Negative for chills and fever.  Respiratory: Negative for shortness of breath.   Cardiovascular: Negative for chest pain.  Gastrointestinal: Positive for abdominal pain and nausea. Negative for constipation, diarrhea and vomiting.  Genitourinary: Positive for dysuria. Negative for frequency, vaginal bleeding and vaginal discharge.  Musculoskeletal: Negative for back pain.  Skin: Negative for rash and wound.  Allergic/Immunologic: Negative for immunocompromised state.  Neurological: Negative for weakness.  All other systems reviewed and are negative.   Physical Exam Updated Vital Signs BP 102/64 (BP Location: Left Arm)   Pulse (!) 49   Temp 98.1 F (36.7 C) (Oral)   Resp 15   Ht 5\' 4"  (1.626 m)   Wt 82.1 kg   LMP 01/01/2020   SpO2 100%   BMI 31.07 kg/m   Physical Exam Vitals and nursing note reviewed.  Constitutional:      General: She is not in acute distress.    Appearance: She is well-developed. She is not diaphoretic.  HENT:     Head: Normocephalic and atraumatic.  Cardiovascular:     Rate and Rhythm: Normal rate and regular rhythm.     Heart sounds: Normal heart sounds.  Pulmonary:     Effort: Pulmonary effort is normal.     Breath sounds: Normal breath sounds.  Abdominal:     Palpations: Abdomen is soft.     Tenderness: There is abdominal tenderness in the right lower quadrant and left lower quadrant. There is rebound.  Skin:    General: Skin is warm and dry.     Findings: No erythema or rash.  Neurological:     Mental Status: She is alert and oriented to person, place, and time.  Psychiatric:        Behavior: Behavior normal.     ED Results  / Procedures / Treatments   Labs (all labs ordered are listed, but only abnormal results are displayed) Labs Reviewed  COMPREHENSIVE METABOLIC PANEL - Abnormal; Notable for the following components:      Result Value   Calcium 8.5 (*)    AST 14 (*)    Total Bilirubin 0.2 (*)    All other components within normal limits  CBC - Abnormal; Notable for the following components:   Hemoglobin 11.8 (*)  RDW 16.4 (*)    All other components within normal limits  LIPASE, BLOOD  URINALYSIS, ROUTINE W REFLEX MICROSCOPIC  I-STAT BETA HCG BLOOD, ED ([facility], [facility], [facility] ONLY)    EKG None  Radiology CT Abdomen Pelvis W Contrast  Result Date: 01/26/2020 CLINICAL DATA:  Right lower quadrant pain and nausea EXAM: CT ABDOMEN AND PELVIS WITH CONTRAST TECHNIQUE: Multidetector CT imaging of the abdomen and pelvis was performed using the standard protocol following bolus administration of intravenous contrast. CONTRAST:  138mL OMNIPAQUE IOHEXOL 300 MG/ML  SOLN COMPARISON:  None. FINDINGS: Lower chest: The visualized heart size within normal limits. No pericardial fluid/thickening. No hiatal hernia. The visualized portions of the lungs are clear. Hepatobiliary: The liver is normal in density without focal abnormality.The main portal vein is patent. No evidence of calcified gallstones, gallbladder wall thickening or biliary dilatation. Pancreas: Unremarkable. No pancreatic ductal dilatation or surrounding inflammatory changes. Spleen: Normal in size without focal abnormality. Adrenals/Urinary Tract: Both adrenal glands appear normal. The kidneys and collecting system appear normal without evidence of urinary tract calculus or hydronephrosis. Bladder is unremarkable. Stomach/Bowel: The stomach, small bowel, and colon are normal in appearance. No inflammatory changes, wall thickening, or obstructive findings.There is a large amount of right colonic stool present. The appendix is unremarkable. Vascular/Lymphatic: There are no  enlarged mesenteric, retroperitoneal, or pelvic lymph nodes. No significant vascular findings are present. Reproductive: The uterus appears to be heterogeneously enlarged with a large posterior heterogeneous mass within the posterior fundus, likely uterine fibroid. There is a hypodense lesions within the lower uterine segment which could represent nabothian cysts. A small amount of free fluid seen within the cul-de-sac. Other: No evidence of abdominal wall mass or hernia. Musculoskeletal: No acute or significant osseous findings. IMPRESSION: Large amount of right colonic stool without evidence of obstruction. Normal appearing appendix Heterogeneously enlarged uterus with a probable posterior uterine fibroid. Electronically Signed   By: Prudencio Pair M.D.   On: 01/26/2020 23:57    Procedures Procedures (including critical care time)  Medications Ordered in ED Medications  sodium chloride flush (NS) 0.9 % injection 3 mL (3 mLs Intravenous Given 01/26/20 2322)  sodium chloride 0.9 % bolus 1,000 mL (0 mLs Intravenous Stopped 01/27/20 0417)  ondansetron (ZOFRAN) injection 4 mg (4 mg Intravenous Given 01/26/20 2319)  HYDROmorphone (DILAUDID) injection 1 mg (1 mg Intravenous Given 01/26/20 2319)  iohexol (OMNIPAQUE) 300 MG/ML solution 100 mL (100 mLs Intravenous Contrast Given 01/26/20 2338)  ketorolac (TORADOL) 30 MG/ML injection 30 mg (30 mg Intravenous Given 01/27/20 0309)    ED Course  I have reviewed the triage vital signs and the nursing notes.  Pertinent labs & imaging results that were available during my care of the patient were reviewed by me and considered in my medical decision making (see chart for details).  Clinical Course as of Jan 26 417  Wed Jan 27, 2680  2423 39 year old female with complaint of lower abdominal pain progressively worsening.  Pain is worse with sitting and describes a pressure, associated with nausea and dysuria.  Denies changes in bowel habits or vaginal discharge.  On  exam, has pain across her lower abdomen with mild rebound tenderness in the right lower quadrant.  CT abdomen pelvis shows constipation and fibroids, normal appendix.  Labs are reassuring including CBC, CMP and normal urinalysis.  Lipase within normal limits, hCG negative.  Patient was given Dilaudid for her pain with limited relief.  Pain has significantly improved after Toradol and patient is ready for  discharge.  Patient will be discharged with prescription for meloxicam as well as Colace, recommend add MiraLAX as needed to help with her constipation and follow-up with her gynecologist.   [LM]    Clinical Course User Index [LM] Roque Lias   MDM Rules/Calculators/A&P                          Final Clinical Impression(s) / ED Diagnoses Final diagnoses:  Lower abdominal pain  Constipation, unspecified constipation type  Uterine leiomyoma, unspecified location    Rx / DC Orders ED Discharge Orders         Ordered    meloxicam (MOBIC) 7.5 MG tablet  Daily     Discontinue  Reprint     01/27/20 0415    docusate sodium (COLACE) 100 MG capsule  2 times daily PRN     Discontinue  Reprint     01/27/20 0416           Tacy Learn, PA-C 01/27/20 7841    Merryl Hacker, MD 01/27/20 [id number]

## 2020-01-26 NOTE — ED Notes (Signed)
Pt provided w/labeled specimen cup for urine collection. [employer]

## 2020-01-27 LAB — URINALYSIS, ROUTINE W REFLEX MICROSCOPIC
Bilirubin Urine: NEGATIVE
Glucose, UA: NEGATIVE mg/dL
Hgb urine dipstick: NEGATIVE
Ketones, ur: NEGATIVE mg/dL
Leukocytes,Ua: NEGATIVE
Nitrite: NEGATIVE
Protein, ur: NEGATIVE mg/dL
Specific Gravity, Urine: 1.023 (ref 1.005–1.030)
pH: 5 (ref 5.0–8.0)

## 2020-01-27 MED ORDER — MELOXICAM 7.5 MG PO TABS: 7.5000 mg | ORAL_TABLET | Freq: Every day | ORAL | 0 refills | Status: AC

## 2020-01-27 MED ORDER — KETOROLAC TROMETHAMINE 30 MG/ML IJ SOLN
30.0000 mg | Freq: Once | INTRAMUSCULAR | Status: AC
  Administered 2020-01-27: 03:00:00 30 mg via INTRAVENOUS
  Filled 2020-01-27: qty 1

## 2020-01-27 MED ORDER — DOCUSATE SODIUM 100 MG PO CAPS: 100.0000 mg | ORAL_CAPSULE | Freq: Two times a day (BID) | ORAL | 0 refills | Status: DC | PRN

## 2020-01-27 NOTE — ED Notes (Signed)
Lab states they did not receive the urine specimen collected.

## 2020-01-27 NOTE — Discharge Instructions (Signed)
Take meloxicam as needed as prescribed for pain.  Do not take other anti-inflammatory medications such as Advil or Aleve. Take Colace twice daily as needed for constipation.  You can also take MiraLAX as directed. Follow-up with your gynecologist, return to ER for new or worsening symptoms.

## 2024-04-10 ENCOUNTER — Ambulatory Visit: Payer: Self-pay | Admitting: Family Medicine

## 2024-04-15 ENCOUNTER — Ambulatory Visit: Admitting: Family Medicine

## 2024-04-15 ENCOUNTER — Encounter: Payer: Self-pay | Admitting: Family Medicine

## 2024-04-15 VITALS — BP 140/90 | HR 67 | Temp 98.6°F | Ht 64.0 in | Wt 197.0 lb

## 2024-04-15 DIAGNOSIS — N803 Endometriosis of pelvic peritoneum, unspecified: Secondary | ICD-10-CM

## 2024-04-15 DIAGNOSIS — Z6833 Body mass index (BMI) 33.0-33.9, adult: Secondary | ICD-10-CM

## 2024-04-15 DIAGNOSIS — F411 Generalized anxiety disorder: Secondary | ICD-10-CM

## 2024-04-15 DIAGNOSIS — E6609 Other obesity due to excess calories: Secondary | ICD-10-CM

## 2024-04-15 DIAGNOSIS — L918 Other hypertrophic disorders of the skin: Secondary | ICD-10-CM | POA: Diagnosis not present

## 2024-04-15 DIAGNOSIS — R7303 Prediabetes: Secondary | ICD-10-CM

## 2024-04-15 DIAGNOSIS — Z2821 Immunization not carried out because of patient refusal: Secondary | ICD-10-CM

## 2024-04-15 DIAGNOSIS — F419 Anxiety disorder, unspecified: Secondary | ICD-10-CM

## 2024-04-15 DIAGNOSIS — F17200 Nicotine dependence, unspecified, uncomplicated: Secondary | ICD-10-CM

## 2024-04-15 DIAGNOSIS — Z7689 Persons encountering health services in other specified circumstances: Secondary | ICD-10-CM

## 2024-04-15 DIAGNOSIS — F32A Depression, unspecified: Secondary | ICD-10-CM

## 2024-04-15 DIAGNOSIS — Z0000 Encounter for general adult medical examination without abnormal findings: Secondary | ICD-10-CM

## 2024-04-15 DIAGNOSIS — I10 Essential (primary) hypertension: Secondary | ICD-10-CM

## 2024-04-15 DIAGNOSIS — E66811 Obesity, class 1: Secondary | ICD-10-CM

## 2024-04-15 DIAGNOSIS — Z136 Encounter for screening for cardiovascular disorders: Secondary | ICD-10-CM

## 2024-04-15 LAB — POCT URINALYSIS DIP (CLINITEK)
Bilirubin, UA: NEGATIVE
Glucose, UA: NEGATIVE mg/dL
Leukocytes, UA: NEGATIVE
Nitrite, UA: NEGATIVE
POC PROTEIN,UA: 30 — AB
Spec Grav, UA: 1.02 (ref 1.010–1.025)
Urobilinogen, UA: 0.2 U/dL
pH, UA: 6.5 (ref 5.0–8.0)

## 2024-04-15 MED ORDER — TRIAMTERENE-HCTZ 37.5-25 MG PO TABS: 1.0000 | ORAL_TABLET | Freq: Every day | ORAL | 1 refills | Status: AC

## 2024-04-15 NOTE — Progress Notes (Addendum)
 I,Jameka J Llittleton, CMA,acting as a Neurosurgeon for [employer], NP.,have documented all relevant documentation on the behalf of Bruna Creighton, NP,as directed by  Bruna Creighton, NP while in the presence of Bruna Creighton, NP.  Subjective:  Patient ID: Danielle Dawson , female    DOB: Sep 16, 1980 , 43 y.o.   MRN: [id number]  Chief Complaint  Patient presents with   Establish Care    Patient presents today to establish care. Patient last seen her previous pcp. Patient was dissatisfied.     Hypertension    Patient presents today for a bpc. Patient reports she last took her bp med in June or July. Patient denies having chest pain,sob or headaches at this time.    abnormal glucose    Patient reports her last A1C was 5.9 she would like to have it rechecked.     HPI Discussed the use of AI scribe software for clinical note transcription with the patient, who gave verbal consent to proceed.  History of Present Illness     Danielle Dawson is a 43 year old female with hypertension who presents to establish care.  She was diagnosed with hypertension in December 2024 and was initially prescribed medication, which she has not been taking recently due to running out of the prescription. She was advised to take half a pill per day. She monitors her blood pressure at home but reports inconsistent readings, attributing this to potential user error. She reports experiencing pain during her menstrual cycle and has questions about how pain may affect her blood pressure.  In April 2025, lab work indicated high cholesterol and a prediabetic state with an A1c of 5.9. At that time, she weighed approximately 212 pounds. She has since lost weight and currently weighs 193 pounds, attributing this to regular exercise, including at least 30 minutes of steady-state cardio and heavy lifting.  She has a history of endometriosis and fibroids, for which she has undergone two surgeries. She experiences significant pain during her  menstrual cycle, affecting her sleep, as she was awake from 4:00 AM to 7:30 AM due to pain, and rates her current pain as 3-4 out of 10. She has not seen a gynecologist in over a year and a half due to moving from [place] and difficulty accessing care. She previously saw Dr. Gigi Salmon and Dr. Willetta for her gynecological issues.  She has a history of anxiety and was previously prescribed Xanax  and Seroquel as needed, but has not taken any medication for anxiety since 2021. She has not sought behavioral health care since her previous provider left practice, citing dissatisfaction with the care she received. She occasionally smokes cigars and has a history of taking Wellbutrin, which she discontinued due to adverse reactions. She has participated in counseling in the past and is open to returning to therapy. Patient and/or legal guardian verbally consented to [facility] services about presenting concerns and psychiatric consultation as appropriate.  The services will be billed as appropriate for the patient   No other pain aside from endometriosis-related pain. Regular bowel movements.  BMI 33,She is encouraged to strive for BMI less than 30 to decrease cardiac risk. Advised to aim for at least 150 minutes of exercise per week.     Hypertension This is a chronic problem. The current episode started more than 1 year ago. The problem has been waxing and waning since onset. The problem is uncontrolled. Associated symptoms include anxiety. Pertinent negatives include no chest pain or  palpitations. Risk factors for coronary artery disease include stress. Past treatments include calcium channel blockers and diuretics. The current treatment provides mild improvement. There are no compliance problems.      Past Medical History:  Diagnosis Date   Anemia    Depression    Endometrial polyp    Endometriosis    GAD (generalized anxiety disorder)    History of panic attacks     Hyperlipidemia    Hypertension    Uterine fibroid      Family History  Problem Relation Age of Onset   Diabetes Maternal Grandfather    Hypertension Maternal Grandfather    Hypertension Mother    Drug abuse Mother        recovery since pt 6y/o   Heart disease Father    Diabetes Maternal Grandmother    Vision loss Maternal Grandmother    Alcohol abuse Cousin    Drug abuse Cousin    Drug abuse Maternal Aunt    Alcohol abuse Maternal Aunt    Alcohol abuse Sister    Depression Maternal Aunt    Alcohol abuse Maternal Aunt    Anxiety disorder Maternal Aunt    Drug abuse Maternal Aunt    Alcohol abuse Maternal Aunt      Current Outpatient Medications:    meloxicam  (MOBIC ) 7.5 MG tablet, Take 1 tablet (7.5 mg total) by mouth daily., Disp: 10 tablet, Rfl: 0   Multiple Vitamin (MULTIVITAMIN WITH MINERALS) TABS tablet, Take 1 tablet by mouth daily., Disp: , Rfl:    ondansetron  (ZOFRAN -ODT) 8 MG disintegrating tablet, Take 1 tablet (8 mg total) by mouth every 8 (eight) hours as needed for nausea or vomiting., Disp: 21 tablet, Rfl: 0   Semaglutide,0.25 or 0.5MG /DOS, (OZEMPIC, 0.25 OR 0.5 MG/DOSE,) 2 MG/1.5ML SOPN, 0.25 mg once a week., Disp: , Rfl:    vitamin B-12 (CYANOCOBALAMIN) 1000 MCG tablet, Take 1,000 mcg daily by mouth., Disp: , Rfl:    ALPRAZolam  (XANAX ) 0.5 MG tablet, Take 0.5 mg by mouth 2 (two) times daily as needed for anxiety. (Patient not taking: Reported on 04/15/2024), Disp: , Rfl:    QUEtiapine (SEROQUEL) 25 MG tablet, Take 25 mg at bedtime as needed by mouth (sleep). (Patient not taking: Reported on 04/15/2024), Disp: , Rfl:    triamterene-hydrochlorothiazide (MAXZIDE-25) 37.5-25 MG tablet, Take 1 tablet by mouth daily., Disp: 90 tablet, Rfl: 1   Allergies  Allergen Reactions   Shellfish Allergy Itching   Vraylar [Cariprazine Hcl] Hives and Itching   Wellbutrin [Bupropion] Hives and Itching     Review of Systems  Constitutional: Negative.   HENT: Negative.     Eyes: Negative.   Respiratory: Negative.    Cardiovascular:  Negative for chest pain, palpitations and leg swelling.  Gastrointestinal:  Positive for abdominal pain.  Endocrine: Negative for polydipsia, polyphagia and polyuria.  Musculoskeletal: Negative.   Skin:        Skin tag around the neck  Neurological: Negative.   Psychiatric/Behavioral:  The patient is nervous/anxious.      Today's Vitals   04/15/24 1112  BP: (!) 140/90  Pulse: 67  Temp: 98.6 F (37 C)  TempSrc: Oral  Weight: 197 lb (89.4 kg)  Height: 5' 4 (1.626 m)  PainSc: 0-No pain   Body mass index is 33.81 kg/m.  Wt Readings from Last 3 Encounters:  04/15/24 197 lb (89.4 kg)  01/26/20 181 lb (82.1 kg)  05/28/18 181 lb (82.1 kg)    The 10-year ASCVD risk score (Arnett  DK, et al., 2019) is: 6.1%   Values used to calculate the score:     Age: 24 years     Clincally relevant sex: Female     Is Non-Hispanic African American: Yes     Diabetic: No     Tobacco smoker: Yes     Systolic Blood Pressure: 140 mmHg     Is BP treated: Yes     HDL Cholesterol: 53 mg/dL     Total Cholesterol: 189 mg/dL  Objective:  Physical Exam Constitutional:      Appearance: Normal appearance.  HENT:     Head: Normocephalic.     Nose: Nose normal.  Cardiovascular:     Rate and Rhythm: Normal rate and regular rhythm.     Pulses: Normal pulses.     Heart sounds: Normal heart sounds.  Pulmonary:     Effort: Pulmonary effort is normal.     Breath sounds: Normal breath sounds.  Abdominal:     General: Bowel sounds are normal.  Musculoskeletal:        General: Normal range of motion.  Skin:    General: Skin is warm and dry.  Neurological:     General: No focal deficit present.     Mental Status: She is alert and oriented to person, place, and time. Mental status is at baseline.  Psychiatric:        Mood and Affect: Mood normal.         Assessment And Plan:  Establishing care with new doctor, encounter  for  Encounter for general adult medical examination w/o abnormal findings  Prediabetes -     Hemoglobin A1c  Influenza vaccination declined  Primary hypertension Assessment & Plan: Emphasized medication adherence and monitoring to prevent complications. - Restart antihypertensive medication with diuretic. - Monitor blood pressure at home. - Return for nurse visit and lab work in 2 weeks for blood pressure and BMP. - Encourage regular exercise and hydration.  Orders: -     POCT URINALYSIS DIP (CLINITEK) -     Microalbumin / creatinine urine ratio -     EKG 12-Lead -     CBC -     CMP14+EGFR -     Triamterene-HCTZ; Take 1 tablet by mouth daily.  Dispense: 90 tablet; Refill: 1  Encounter for screening for cardiovascular disorders -     Lipid panel  Skin tag -     Ambulatory referral to Dermatology  Endometriosis of pelvic peritoneum Assessment & Plan: Endometriosis with two previous surgeries, experiencing menstrual pain.   Orders: -     Ambulatory referral to Gynecology  Anxiety and depression Assessment & Plan: Previous treatment with Xanax  and Seroquel, no current medication since 2021. Interested in counseling and medication management   Orders: -     Amb ref to Integrated Behavioral Health  Tobacco use disorder Assessment & Plan: Discussed health risks and encouraged cessation.   Class 1 obesity due to excess calories with serious comorbidity and body mass index (BMI) of 33.0 to 33.9 in adult Assessment & Plan: She is encouraged to strive for BMI less than 30 to decrease cardiac risk. Advised to aim for at least 150 minutes of exercise per week.     Assessment & Plan Adult Wellness Visit Routine wellness visit to establish care and emphasize regular check-ups and chronic condition monitoring. - Schedule annual physical exam. - Perform lab tests: A1c, cholesterol, kidney function, liver function.  Essential hypertension Diagnosed December 2024 with  elevated blood  pressure. Emphasized medication adherence and monitoring to prevent complications. - Restart antihypertensive medication with diuretic. - Monitor blood pressure at home. - Return for nurse visit and lab work in 2 weeks for blood pressure and BMP. - Encourage regular exercise and hydration.  Prediabetes Diagnosed with A1c of 5.9 in April. - Encourage continued weight loss and regular exercise. - Monitor A1c levels.  Obesity Significant weight loss from 212 to 193 pounds through exercise. Discussed benefits of continued weight management. - Encourage continued weight loss and regular exercise.  Endometriosis Discussed further management options. - Provide referral to GYN for further evaluation and management.  Anxiety disorder  - Provide referral to mental health specialist for counseling and medication evaluation.  Tobacco use (cigar) Occasional cigar use. - Advise cessation of cigar use.   Return in about 2 weeks (around 04/29/2024) for NV/lab visit BMP, 6 months, 1 year physical.  Patient was given opportunity to ask questions. Patient verbalized understanding of the plan and was able to repeat key elements of the plan. All questions were answered to their satisfaction.    I, Bruna Creighton, NP, have reviewed all documentation for this visit. The documentation on 04/30/2023 for the exam, diagnosis, procedures, and orders are all accurate and complete.   IF YOU HAVE BEEN REFERRED TO A SPECIALIST, IT MAY TAKE 1-2 WEEKS TO SCHEDULE/PROCESS THE REFERRAL. IF YOU HAVE NOT HEARD FROM US /SPECIALIST IN TWO WEEKS, PLEASE GIVE US  A CALL AT [phone].

## 2024-04-21 LAB — CMP14+EGFR
ALT: 7 IU/L (ref 0–32)
AST: 13 IU/L (ref 0–40)
Albumin: 4 g/dL (ref 3.9–4.9)
Alkaline Phosphatase: 80 IU/L (ref 41–116)
BUN/Creatinine Ratio: 11 (ref 9–23)
BUN: 8 mg/dL (ref 6–24)
Bilirubin Total: <0.2 mg/dL (ref 0.0–1.2)
CO2: 20 mmol/L (ref 20–29)
Calcium: 8.7 mg/dL (ref 8.7–10.2)
Chloride: 106 mmol/L (ref 96–106)
Creatinine, Ser: 0.76 mg/dL (ref 0.57–1.00)
Globulin, Total: 2.9 g/dL (ref 1.5–4.5)
Glucose: 77 mg/dL (ref 70–99)
Potassium: 4.8 mmol/L (ref 3.5–5.2)
Sodium: 139 mmol/L (ref 134–144)
Total Protein: 6.9 g/dL (ref 6.0–8.5)
eGFR: 100 mL/min/1.73 (ref >59)

## 2024-04-21 LAB — CBC
Hematocrit: 36.2 % (ref 34.0–46.6)
Hemoglobin: 10.6 g/dL — ABNORMAL LOW (ref 11.1–15.9)
MCH: 23.7 pg — ABNORMAL LOW (ref 26.6–33.0)
MCHC: 29.3 g/dL — ABNORMAL LOW (ref 31.5–35.7)
MCV: 81 fL (ref 79–97)
Platelets: 530 x10E3/uL — ABNORMAL HIGH (ref 150–450)
RBC: 4.48 x10E6/uL (ref 3.77–5.28)
RDW: 17 % — ABNORMAL HIGH (ref 11.7–15.4)
WBC: 7.9 x10E3/uL (ref 3.4–10.8)

## 2024-04-21 LAB — MICROALBUMIN / CREATININE URINE RATIO
Creatinine, Urine: 218.4 mg/dL
Microalb/Creat Ratio: 9 mg/g{creat} (ref 0–29)
Microalbumin, Urine: 18.8 ug/mL

## 2024-04-21 LAB — HEMOGLOBIN A1C
Est. average glucose Bld gHb Est-mCnc: 103 mg/dL
Hgb A1c MFr Bld: 5.2 % (ref 4.8–5.6)

## 2024-04-21 LAB — LIPID PANEL
Chol/HDL Ratio: 3.6 ratio (ref 0.0–4.4)
Cholesterol, Total: 189 mg/dL (ref 100–199)
HDL: 53 mg/dL (ref >39)
LDL Chol Calc (NIH): 124 mg/dL — ABNORMAL HIGH (ref 0–99)
Triglycerides: 64 mg/dL (ref 0–149)
VLDL Cholesterol Cal: 12 mg/dL (ref 5–40)

## 2024-04-30 ENCOUNTER — Ambulatory Visit: Payer: Self-pay | Admitting: Family Medicine

## 2024-04-30 ENCOUNTER — Ambulatory Visit: Payer: Self-pay | Admitting: Licensed Clinical Social Worker

## 2024-04-30 DIAGNOSIS — E66811 Obesity, class 1: Secondary | ICD-10-CM | POA: Insufficient documentation

## 2024-04-30 DIAGNOSIS — Z2821 Immunization not carried out because of patient refusal: Secondary | ICD-10-CM | POA: Insufficient documentation

## 2024-04-30 DIAGNOSIS — L918 Other hypertrophic disorders of the skin: Secondary | ICD-10-CM | POA: Insufficient documentation

## 2024-04-30 DIAGNOSIS — F17200 Nicotine dependence, unspecified, uncomplicated: Secondary | ICD-10-CM | POA: Insufficient documentation

## 2024-04-30 DIAGNOSIS — F39 Unspecified mood [affective] disorder: Secondary | ICD-10-CM

## 2024-04-30 DIAGNOSIS — R7303 Prediabetes: Secondary | ICD-10-CM | POA: Insufficient documentation

## 2024-04-30 DIAGNOSIS — I10 Essential (primary) hypertension: Secondary | ICD-10-CM | POA: Insufficient documentation

## 2024-04-30 DIAGNOSIS — F32A Depression, unspecified: Secondary | ICD-10-CM | POA: Insufficient documentation

## 2024-04-30 DIAGNOSIS — Z0000 Encounter for general adult medical examination without abnormal findings: Secondary | ICD-10-CM | POA: Insufficient documentation

## 2024-04-30 MED ORDER — ACCRUFER 30 MG PO CAPS: 30.0000 mg | ORAL_CAPSULE | Freq: Every day | ORAL | 1 refills | Status: AC

## 2024-04-30 NOTE — Assessment & Plan Note (Signed)
 Endometriosis with two previous surgeries, experiencing menstrual pain.

## 2024-04-30 NOTE — Assessment & Plan Note (Signed)
 Discussed health risks and encouraged cessation.
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
## 2024-04-30 NOTE — Assessment & Plan Note (Signed)
 Emphasized medication adherence and monitoring to prevent complications. - Restart antihypertensive medication with diuretic. - Monitor blood pressure at home. - Return for nurse visit and lab work in 2 weeks for blood pressure and BMP. - Encourage regular exercise and hydration.

## 2024-04-30 NOTE — Patient Instructions (Signed)
 Using Behavioral Activation to manage stress/depression symptoms    Identify/understand your own mood triggers.   Structure your day--get up around the same time, eat meals/snacks around the same time, go to bed around the same time.   Purposefully schedule self care time and time to complete tasks. This can include quiet time  Stimulate your brain--go for a walk, text/call a friend or family member, if you are indoors--go outside (and vice versa), go for a drive, go to a store with bright colors and bright lights. Try to do things in a different way--drive to your favorite places using an alternative route, or instead of starting on the right side of the grocery store when shopping, start on the left side. You might feel a bit uncomfortable doing things outside of the comfort zone, but this is helping the brain create new neural pathways and is very healthy for brain/emotional health.   Physical movement based on your ability. If you can go for a walk, do stretches, even waving your hands to music can trigger feel-good endorphins in the brain and help release physical tension we all hold in our bodies.  Even 5 minutes can make a difference.   Be intentional about doing things that bring you joy (or used to bring you joy), and look for the things in every day that make you happy.  Seek those glimmers of joy each day.  Set a timer for 5 minutes for a harder task (ex. Laundry, washing dishes).  Allow yourself to work distraction-free for 5 minutes, then stop when the timer goes off. If you need a break, take a break. If you want to continue working then set another timer for whatever time you choose.   Limit or eliminate substance use including alcohol, marijuana, or recreational use of prescription medication.  Let in the light!! Open the window blinds, curtains and let natural light in. Even sitting near a window or sitting outside can boost your mood, especially in the wintertime when there  is less daylight.    Things to envision for ourselves to to improve inspiration, motivation, and initiative :  improving physical wellness, focus on family relationships, focusing on our own mental/emotional well being, being a part of a bigger community, finding a hobby, being a part of something that fosters personal growth, engaging socially with others (even digitally!!!)   _____________________________________________________________________________________________________  ANXIETY/PANIC EPISODE MANAGEMENT (CBT/MINDFULNESS BASED)   If you are in a highly stimulating or triggering environment, change your location to a less stimulating or safer environment .   Stimulate your senses by tasting/eating a sour candy (such as a lemon drop) or a strong flavored cough drop.  This triggers smell, taste, and touch since we  have had lots of nerve endings inside of your mouth.   Practice slow, controlled breathing to avoid hyperventilation.  Breathing into your nose for the count of 4 inside your head, holding your breath for the count of 4 inside your head, and exhale slowly counting to 8 inside your head.  This is called 4-4-8 breathing, or triangle breathing. (Controlled breathing). This helps manage panic, anxiety, anger, and tearfulness.  Additional grounding exercises include rubbing her hands softly together, and wiggling your toes inside your shoes, pretending to grasp the floor with your toes.    Listen to calming music or sounds  Count backwards in your head by twos or tens or recite multiplication tables in your head.  Visualize a calming, happy place and identify 5 explicit details  about this place (color, temperature, smells, visual details, etc.)  Splash cold water on your face or hold an ice cube in your hand (alternate hands)  Clench and release muscle groups (hands, shoulders, facial muscles)  Engage in soothing activities to recover after a stressful/anxious episode such  as: drinking a warm beverage, sitting in your favorite comfortable location, positive physical contact with a pet or weighted blanket, using positive self talk/positive affirmations.   Download PTSD Coach app for your phone or tablet--its free and has lots of tips to help you manage panic episodes no matter where you are!   ________________________________________________________________________________________________________ Sleep Hygiene Tips  Bedroom Environment - Keep your bedroom cool, quiet, and dark - Use blackout curtains or a sleep mask - Limit noise with earplugs or a white noise machine - Invest in a comfortable mattress and pillows - Remove electronic devices or dim brightness  Sleep Schedule - Go to bed and wake up at the same time every day, even on weekends - Avoid long naps during the day (limit to 2030 minutes if needed) - Establish a relaxing pre-sleep routine (e.g., reading, gentle stretching)  Diet & Substances - Avoid caffeine, nicotine, and alcohol close to bedtime - Don't go to bed hungry or overly full - Limit fluid intake in the evening to reduce nighttime bathroom trips  Technology & Stimulation - Avoid screens (phones, tablets, TVs) at least 30-60 minutes before bed - Use blue light filters if screen use is unavoidable - Don't work or watch intense/triggering content in bed  Mental & Physical Health - Practice relaxation techniques like deep breathing, meditation, or progressive muscle relaxation - Exercise regularly, but not too close to bedtime - Manage stress and anxiety through journaling, therapy, or mindfulness  When You Cant Sleep - Get out of bed if you can't sleep after 20 minutes - Do something calming in dim light, then return to bed when sleepy - Avoid clock-watching, which can increase anxiety       Emergency Resources:  National Suicide & Crisis Lifeline: Call or text 988  Crisis Text Line: Text HOME to 236-370-2953  [facility]  [address], [place], [place] [address] [phone] or [phone] WALK-IN URGENT CARE 24/7 FOR ANYONE [address] [place], [place]  [phone] Fax: [phone] [email or web address] *Interpreters available *Accepts all insurance and uninsured for Urgent Care needs *Accepts Medicaid and uninsured for outpatient treatment (below)

## 2024-04-30 NOTE — BH Specialist Note (Addendum)
 Pt rescheduled appointment to 3pm

## 2024-04-30 NOTE — Assessment & Plan Note (Signed)
 Previous treatment with Xanax  and Seroquel, no current medication since 2021. Interested in counseling and medication management

## 2024-04-30 NOTE — Assessment & Plan Note (Signed)
 She is encouraged to strive for BMI less than 30 to decrease cardiac risk. Advised to aim for at least 150 minutes of exercise per week.

## 2024-05-01 ENCOUNTER — Ambulatory Visit: Payer: Self-pay

## 2024-05-01 VITALS — BP 122/80 | HR 85 | Temp 98.1°F | Ht 64.0 in | Wt 197.0 lb

## 2024-05-01 DIAGNOSIS — I10 Essential (primary) hypertension: Secondary | ICD-10-CM

## 2024-05-01 DIAGNOSIS — Z79899 Other long term (current) drug therapy: Secondary | ICD-10-CM

## 2024-05-01 NOTE — Progress Notes (Signed)
 Patient is in office today for a nurse visit for Blood Pressure Check & BMP lab visit. Patient currently takes Triamterene- hydrochlorothiazide 37.5-25mg  in the mornings. Denies headache, chest pain & sob.

## 2024-05-01 NOTE — Patient Instructions (Signed)
 Hypertension, Adult Hypertension is another name for high blood pressure. High blood pressure forces your heart to work harder to pump blood. This can cause problems over time. There are two numbers in a blood pressure reading. There is a top number (systolic) over a bottom number (diastolic). It is best to have a blood pressure that is below 120/80. What are the causes? The cause of this condition is not known. Some other conditions can lead to high blood pressure. What increases the risk? Some lifestyle factors can make you more likely to develop high blood pressure: Smoking. Not getting enough exercise or physical activity. Being overweight. Having too much fat, sugar, calories, or salt (sodium) in your diet. Drinking too much alcohol. Other risk factors include: Having any of these conditions: Heart disease. Diabetes. High cholesterol. Kidney disease. Obstructive sleep apnea. Having a family history of high blood pressure and high cholesterol. Age. The risk increases with age. Stress. What are the signs or symptoms? High blood pressure may not cause symptoms. Very high blood pressure (hypertensive crisis) may cause: Headache. Fast or uneven heartbeats (palpitations). Shortness of breath. Nosebleed. Vomiting or feeling like you may vomit (nauseous). Changes in how you see. Very bad chest pain. Feeling dizzy. Seizures. How is this treated? This condition is treated by making healthy lifestyle changes, such as: Eating healthy foods. Exercising more. Drinking less alcohol. Your doctor may prescribe medicine if lifestyle changes do not help enough and if: Your top number is above 130. Your bottom number is above 80. Your personal target blood pressure may vary. Follow these instructions at home: Eating and drinking  If told, follow the DASH eating plan. To follow this plan: Fill one half of your plate at each meal with fruits and vegetables. Fill one fourth of your plate  at each meal with whole grains. Whole grains include whole-wheat pasta, brown rice, and whole-grain bread. Eat or drink low-fat dairy products, such as skim milk or low-fat yogurt. Fill one fourth of your plate at each meal with low-fat (lean) proteins. Low-fat proteins include fish, chicken without skin, eggs, beans, and tofu. Avoid fatty meat, cured and processed meat, or chicken with skin. Avoid pre-made or processed food. Limit the amount of salt in your diet to less than 1,500 mg each day. Do not drink alcohol if: Your doctor tells you not to drink. You are pregnant, may be pregnant, or are planning to become pregnant. If you drink alcohol: Limit how much you have to: 0-1 drink a day for women. 0-2 drinks a day for men. Know how much alcohol is in your drink. In the [place] one drink equals one 12 oz bottle of beer (355 mL), one 5 oz glass of wine (148 mL), or one 1 oz glass of hard liquor (44 mL). Lifestyle  Work with your doctor to stay at a healthy weight or to lose weight. Ask your doctor what the best weight is for you. Get at least 30 minutes of exercise that causes your heart to beat faster (aerobic exercise) most days of the week. This may include walking, swimming, or biking. Get at least 30 minutes of exercise that strengthens your muscles (resistance exercise) at least 3 days a week. This may include lifting weights or doing Pilates. Do not smoke or use any products that contain nicotine or tobacco. If you need help quitting, ask your doctor. Check your blood pressure at home as told by your doctor. Keep all follow-up visits. Medicines Take over-the-counter and prescription medicines  only as told by your doctor. Follow directions carefully. Do not skip doses of blood pressure medicine. The medicine does not work as well if you skip doses. Skipping doses also puts you at risk for problems. Ask your doctor about side effects or reactions to medicines that you should watch  for. Contact a doctor if: You think you are having a reaction to the medicine you are taking. You have headaches that keep coming back. You feel dizzy. You have swelling in your ankles. You have trouble with your vision. Get help right away if: You get a very bad headache. You start to feel mixed up (confused). You feel weak or numb. You feel faint. You have very bad pain in your: Chest. Belly (abdomen). You vomit more than once. You have trouble breathing. These symptoms may be an emergency. Get help right away. Call 911. Do not wait to see if the symptoms will go away. Do not drive yourself to the hospital. Summary Hypertension is another name for high blood pressure. High blood pressure forces your heart to work harder to pump blood. For most people, a normal blood pressure is less than 120/80. Making healthy choices can help lower blood pressure. If your blood pressure does not get lower with healthy choices, you may need to take medicine. This information is not intended to replace advice given to you by your health care provider. Make sure you discuss any questions you have with your health care provider. Document Revised: 04/20/2021 Document Reviewed: 04/20/2021 Elsevier Patient Education  2024 [employer].

## 2024-05-01 NOTE — Addendum Note (Signed)
 Addended by: Concettina Leth R on: 05/01/2024 04:36 PM   Modules accepted: Orders

## 2024-05-02 LAB — BMP8+EGFR
BUN/Creatinine Ratio: 15 (ref 9–23)
BUN: 12 mg/dL (ref 6–24)
CO2: 22 mmol/L (ref 20–29)
Calcium: 8.8 mg/dL (ref 8.7–10.2)
Chloride: 106 mmol/L (ref 96–106)
Creatinine, Ser: 0.8 mg/dL (ref 0.57–1.00)
Glucose: 62 mg/dL — ABNORMAL LOW (ref 70–99)
Potassium: 4.4 mmol/L (ref 3.5–5.2)
Sodium: 141 mmol/L (ref 134–144)
eGFR: 94 mL/min/1.73 (ref >59)

## 2024-05-06 ENCOUNTER — Telehealth: Payer: Self-pay | Admitting: Licensed Clinical Social Worker

## 2024-05-06 DIAGNOSIS — F39 Unspecified mood [affective] disorder: Secondary | ICD-10-CM

## 2024-05-06 DIAGNOSIS — F331 Major depressive disorder, recurrent, moderate: Secondary | ICD-10-CM
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
## 2024-05-12 ENCOUNTER — Ambulatory Visit: Payer: Self-pay | Admitting: Licensed Clinical Social Worker

## 2024-05-12 DIAGNOSIS — F39 Unspecified mood [affective] disorder: Secondary | ICD-10-CM

## 2024-05-12 NOTE — BH Specialist Note (Unsigned)
 Virtual Visit via Video Note  I connected with Evony M Coderre on 05/12/24 at 11:30 AM EDT by a video enabled telemedicine application and verified that I am speaking with the correct person using two identifiers.  Location: Patient: Patient primary residence, [place] Provider: Clinician Virtual office, [place]   I discussed the limitations of evaluation and management by telemedicine and the availability of in person appointments. The patient expressed understanding and agreed to proceed.   I discussed the assessment and treatment plan with the patient. The patient was provided an opportunity to ask questions and all were answered. The patient agreed with the plan and demonstrated an understanding of the instructions.   The patient was advised to call back or seek an in-person evaluation if the symptoms worsen or if the condition fails to improve as anticipated. Integrated Behavioral Health Follow Up In-Person Visit  MRN: [id number] Name: MIHIKA SURRETTE  Number of Integrated Behavioral Health Clinician visits: 3- Third Visit  Session Start time: 1100   Session End time: 1139  Total time in minutes: 39   Encounter Diagnosis  Name Primary?   Mood disorder Yes      Types of Service: Video visit and Collaborative care  Subjective: LOUELLEN HALDEMAN is a 43 y.o. female accompanied by self--adult.   Patient was referred by Bruna Creighton NP for establishment of [facility] Collaborative Care. Patient reports the following symptoms/concerns: mood-related changes and trying to cut back on etoh use. Pt reports that she scheduled ADHD testing after receiving referral phone call.   Duration of problem: ongoing; Severity of problem: mild  Objective: Mood: Anxious and Depressed and Affect: Appropriate Risk of harm to self or others: No plan to harm self or others  Life Context: Family and Social: Pt reports that she is continuing to have stress associated with her primary relationship. Allowed pt to  explore thoughts and feelings associated with the relationship and discussed pros/cons of referring to couples counseling. Discussed relationship patterns in self and partner and explored origins of the patterns.   School/Work: Pt reports that she recently started a new permanent position and is feeling more stable about this versus previous temporary position   Self-Care: Pt reports that she is prioritizing going to the gym daily that is my self care.  Pt reports that she is noticing that she is craving sugar/sweets more recently and is concerned about this. Discussed prioritizing protein and cutting carbs.  Life Changes: Pt denies any significant life changes since last session--pt does not want referral to psychiatrist currently for medication management of symptoms and prefers behavioral interventions first. Pt continuing to struggle w/ sleep quality/quantity.   Patient and/or Family's Strengths/Protective Factors: Social connections, Social and Patent attorney, [place] supports in place (healthy food, safe environments, etc.), Sense of purpose, and Physical Health (exercise, healthy diet, medication compliance, etc.)  Goals Addressed: Patient will:  Reduce symptoms of: mood instability and stress   Increase knowledge and/or ability of: coping skills, healthy habits, self-management skills, and stress reduction   Demonstrate ability to: Increase healthy adjustment to current life circumstances  Progress towards Goals: Ongoing  Interventions: Interventions utilized:  Behavioral Activation, Supportive Counseling, and Communication Skills Standardized Assessments completed: Not Needed   Patient and/or Family Response: Pt reports that she is willing to try to make some behavioral changes first before moving forward to psychiatric medication recommendations at this point in time.   Patient Centered Plan: Patient is on the following Treatment Plan(s):  ADHD referral for testing,  client requests a neurology consult for ADHD and Autism testing. (Pt has scheduled appt for ADHD testing) 2. Due to severe side effects from previous psychiatric medications, refer to a psychiatrist or psych specialist for medication management.(Pt does not want psych referral at time of session) 3. Recommend therapy in outpatient setting 4. BH specialist to follow up.  Clinical Assessment/Diagnosis  Mood disorder    Assessment: Patient currently experiencing improving symptoms--pt does not want referral to psychiatrist for medication at this time--wants to continue to focus on behavior changes. .   Patient may benefit from Continued IBH supports with referrals PRN.  Plan: Follow up with behavioral health clinician on : 05/28/24 Behavioral recommendations: Continue behavioral activation, be aware of self and behaviors, engage in self care behavior Referral(s): Psychological Evaluation/Testing and Psychiatrist (Pt has secured appt w/ psychology for ADHD testing and is not interested in psychiatric appointment for med management).  Encouraged couples counseling to assist w/ relationship stress. Pt is currently thinking about it.   Patrica Mendell R Iyanla Eilers, LCSW

## 2024-05-13 NOTE — Patient Instructions (Signed)
 Using Behavioral Activation to manage stress/depression symptoms    Identify/understand your own mood triggers.   Structure your day--get up around the same time, eat meals/snacks around the same time, go to bed around the same time.   Purposefully schedule self care time and time to complete tasks. This can include quiet time  Stimulate your brain--go for a walk, text/call a friend or family member, if you are indoors--go outside (and vice versa), go for a drive, go to a store with bright colors and bright lights. Try to do things in a different way--drive to your favorite places using an alternative route, or instead of starting on the right side of the grocery store when shopping, start on the left side. You might feel a bit uncomfortable doing things outside of the comfort zone, but this is helping the brain create new neural pathways and is very healthy for brain/emotional health.   Physical movement based on your ability. If you can go for a walk, do stretches, even waving your hands to music can trigger feel-good endorphins in the brain and help release physical tension we all hold in our bodies.  Even 5 minutes can make a difference.   Be intentional about doing things that bring you joy (or used to bring you joy), and look for the things in every day that make you happy.  Seek those glimmers of joy each day.  Set a timer for 5 minutes for a harder task (ex. Laundry, washing dishes).  Allow yourself to work distraction-free for 5 minutes, then stop when the timer goes off. If you need a break, take a break. If you want to continue working then set another timer for whatever time you choose.   Limit or eliminate substance use including alcohol, marijuana, or recreational use of prescription medication.  Let in the light!! Open the window blinds, curtains and let natural light in. Even sitting near a window or sitting outside can boost your mood, especially in the wintertime when there  is less daylight.    Things to envision for ourselves to to improve inspiration, motivation, and initiative :  improving physical wellness, focus on family relationships, focusing on our own mental/emotional well being, being a part of a bigger community, finding a hobby, being a part of something that fosters personal growth, engaging socially with others (even digitally!!!)    ANXIETY/PANIC EPISODE MANAGEMENT (CBT/MINDFULNESS BASED)   If you are in a highly stimulating or triggering environment, change your location to a less stimulating or safer environment .   Stimulate your senses by tasting/eating a sour candy (such as a lemon drop) or a strong flavored cough drop.  This triggers smell, taste, and touch since we  have had lots of nerve endings inside of your mouth.   Practice slow, controlled breathing to avoid hyperventilation.  Breathing into your nose for the count of 4 inside your head, holding your breath for the count of 4 inside your head, and exhale slowly counting to 8 inside your head.  This is called 4-4-8 breathing, or triangle breathing. (Controlled breathing). This helps manage panic, anxiety, anger, and tearfulness.  Additional grounding exercises include rubbing her hands softly together, and wiggling your toes inside your shoes, pretending to grasp the floor with your toes.    Listen to calming music or sounds  Count backwards in your head by twos or tens or recite multiplication tables in your head.  Visualize a calming, happy place and identify 5 explicit details about  this place (color, temperature, smells, visual details, etc.)  Splash cold water on your face or hold an ice cube in your hand (alternate hands)  Clench and release muscle groups (hands, shoulders, facial muscles)  Engage in soothing activities to recover after a stressful/anxious episode such as: drinking a warm beverage, sitting in your favorite comfortable location, positive physical contact  with a pet or weighted blanket, using positive self talk/positive affirmations.   Download PTSD Coach app for your phone or tablet--its free and has lots of tips to help you manage panic episodes no matter where you are!     Emergency Resources:  National Suicide & Crisis Lifeline: Call or text 988  Crisis Text Line: Text HOME to (763)206-8348  [facility]  [address], [place], [place] [address] [phone] or [phone] [facility] 24/7 FOR ANYONE [address], [place], [place]  [phone] Fax: [phone] [email or web address] *Interpreters available *Accepts all insurance and uninsured for Urgent Care needs *Accepts Medicaid and uninsured for outpatient treatment (below)

## 2024-05-26 ENCOUNTER — Ambulatory Visit: Admitting: Obstetrics and Gynecology

## 2024-05-28 ENCOUNTER — Ambulatory Visit (INDEPENDENT_AMBULATORY_CARE_PROVIDER_SITE_OTHER): Admitting: Licensed Clinical Social Worker

## 2024-05-28 DIAGNOSIS — Z91199 Patient's noncompliance with other medical treatment and regimen due to unspecified reason: Secondary | ICD-10-CM

## 2024-05-28 DIAGNOSIS — F39 Unspecified mood [affective] disorder: Secondary | ICD-10-CM

## 2024-05-28 NOTE — Patient Instructions (Signed)
 Using Behavioral Activation to manage stress/depression symptoms    Identify/understand your own mood triggers.   Structure your day--get up around the same time, eat meals/snacks around the same time, go to bed around the same time.   Purposefully schedule self care time and time to complete tasks. This can include quiet time  Stimulate your brain--go for a walk, text/call a friend or family member, if you are indoors--go outside (and vice versa), go for a drive, go to a store with bright colors and bright lights. Try to do things in a different way--drive to your favorite places using an alternative route, or instead of starting on the right side of the grocery store when shopping, start on the left side. You might feel a bit uncomfortable doing things outside of the comfort zone, but this is helping the brain create new neural pathways and is very healthy for brain/emotional health.   Physical movement based on your ability. If you can go for a walk, do stretches, even waving your hands to music can trigger feel-good endorphins in the brain and help release physical tension we all hold in our bodies.  Even 5 minutes can make a difference.   Be intentional about doing things that bring you joy (or used to bring you joy), and look for the things in every day that make you happy.  Seek those glimmers of joy each day.  Set a timer for 5 minutes for a harder task (ex. Laundry, washing dishes).  Allow yourself to work distraction-free for 5 minutes, then stop when the timer goes off. If you need a break, take a break. If you want to continue working then set another timer for whatever time you choose.   Limit or eliminate substance use including alcohol, marijuana, or recreational use of prescription medication.  Let in the light!! Open the window blinds, curtains and let natural light in. Even sitting near a window or sitting outside can boost your mood, especially in the wintertime when there  is less daylight.    Things to envision for ourselves to to improve inspiration, motivation, and initiative :  improving physical wellness, focus on family relationships, focusing on our own mental/emotional well being, being a part of a bigger community, finding a hobby, being a part of something that fosters personal growth, engaging socially with others (even digitally!!!)   The Five Love Languages Based on Dr. Arley Chapman's framework  1. Words of Affirmation What it means: Expressing love through spoken or written words. Examples: Compliments, encouraging notes, verbal appreciation. Why it matters: People with this love language feel valued when they hear affirming words.   2. Acts of Service What it means: Showing love by doing helpful tasks. Examples: Cooking a meal, running errands, helping with chores. Why it matters: Actions speak louder than words for these individuals.   3. Receiving Gifts What it means: Giving thoughtful items as symbols of love. Examples: A favorite snack, a meaningful book, flowers. Why it matters: Gifts represent care and thoughtfulness, not materialism.   4. Quality Time What it means: Spending focused, undistracted time together. Examples: Deep conversations, shared activities, date nights. Why it matters: Presence and attention are key for these individuals.   5. Physical Touch What it means: Expressing love through physical contact. Examples: Hugs, holding hands, gentle touches. Why it matters: Physical closeness provides comfort and connection.   Tips for Using Love Languages Identify your own love language and your partner's. Communicate openly about what makes you  feel loved. Practice regularly--small, consistent actions matter most.    Emergency Resources:  National Suicide & Crisis Lifeline: Call or text 988  Crisis Text Line: Text HOME to 717-052-1436  [facility]  [address] [place], [place] [address]  [phone] or [phone] [facility] 24/7 FOR ANYONE [address] [place], [place]  [phone] Fax: [phone] [email or web address] *Interpreters available *Accepts all insurance and uninsured for Urgent Care needs *Accepts Medicaid and uninsured for outpatient treatment (below)

## 2024-05-28 NOTE — BH Specialist Note (Signed)
 [facility] Collaborative Care Clinician attempted to connect with patient for scheduled virtual appointment via Caregility. Patient was sent text x 2 and email x 2.  Pt did not connect in virtual room--after 15 minutes, clinician closed out Caregility virtual room.
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
## 2024-06-02 ENCOUNTER — Telehealth (INDEPENDENT_AMBULATORY_CARE_PROVIDER_SITE_OTHER): Payer: Self-pay | Admitting: Licensed Clinical Social Worker

## 2024-06-02 DIAGNOSIS — F331 Major depressive disorder, recurrent, moderate: Secondary | ICD-10-CM
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
## 2024-06-03 NOTE — Addendum Note (Signed)
 Addended by: JACQUETTA SHARLOT GRADE on: 06/03/2024 09:58 AM   Modules accepted: Orders

## 2024-06-04 ENCOUNTER — Other Ambulatory Visit: Payer: Self-pay | Admitting: Family Medicine

## 2024-06-04 ENCOUNTER — Ambulatory Visit: Admitting: Licensed Clinical Social Worker

## 2024-06-04 DIAGNOSIS — F331 Major depressive disorder, recurrent, moderate: Secondary | ICD-10-CM

## 2024-06-04 DIAGNOSIS — F32A Depression, unspecified: Secondary | ICD-10-CM

## 2024-06-04 MED ORDER — QUETIAPINE FUMARATE 25 MG PO TABS: 25.0000 mg | ORAL_TABLET | Freq: Every evening | ORAL | 1 refills | Status: AC | PRN
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
## 2024-06-04 NOTE — Patient Instructions (Signed)
 CBT tips for stopping negative thoughts  ?? Step 1: Notice the Thought Instead of letting negative thoughts spiral, catch them early. Ask: What did I just say to myself? Would I say that to someone I care about?  ? Step 2: Interrupt the Thought Use mental cues to break the cycle: Say (out loud or silently): "Stop." Visualize a red stop sign or press a mental "pause" button. Stand up or move -- physical action can disrupt mental loops.  ?? Step 3: Challenge It Don't take your thoughts at face value -- question them. Ask: Is this 100% true? What's the evidence for and against it? Is there another, kinder way to view this? Example: Negative Thought: "I always mess things up." Challenge: "Really? Always? What about the time I handled [situation] well?"  ?? Step 4: Replace with a Balanced Thought Not fake positivity -- realistic, compassionate truth. Instead of: "I'm worthless." Try: "I'm struggling right now, but that doesn't mean I'm worthless." Instead of: "I'm not good enough." Try: "I'm learning and growing. That's enough for today."  ?? Step 5: Build Mental Habits You can't control every thought, but you can train your response to them over time. Tools that help: Journaling: Write down the thoughts and challenge them on paper. Cognitive Behavioral Therapy (CBT) techniques Meditation/Mindfulness: Builds awareness without judgment. Positive self-talk: Practice daily affirmations that feel believable.  ?? Important: What Not to Do Don't try to suppress the thoughts -- it often backfires. Don't beat yourself up for having them -- that's part of the loop. Don't wait for motivation -- action comes first, mood follows.  ?? Gentle Reminder Having negative thoughts doesn't mean something is wrong with you -- it means you're human. But those thoughts are not FACTS. You can learn to respond differently. And over time, that changes everything.   Emergency Resources:  National  Suicide & Crisis Lifeline: Call or text 988  Crisis Text Line: Text HOME to 301-125-5488  [facility]  [address] [place], [place] [address] [phone] or [phone] [facility] 24/7 FOR ANYONE [address], [place], [place]  [phone] Fax: [phone] [email or web address] *Interpreters available *Accepts all insurance and uninsured for Urgent Care needs *Accepts Medicaid and uninsured for outpatient treatment (below)

## 2024-06-16 ENCOUNTER — Ambulatory Visit: Admitting: Licensed Clinical Social Worker

## 2024-06-16 DIAGNOSIS — F331 Major depressive disorder, recurrent, moderate: Secondary | ICD-10-CM

## 2024-06-16 DIAGNOSIS — F411 Generalized anxiety disorder: Secondary | ICD-10-CM

## 2024-06-16 NOTE — BH Specialist Note (Unsigned)
 Virtual Visit via Video Note  I connected with Danielle Dawson on 06/16/24 at  3:00 PM EST by a video enabled telemedicine application and verified that I am speaking with the correct person using two identifiers.  Location: Patient: Patient primary residence, [place] Provider: Clinician Virtual office, [place]   I discussed the limitations of evaluation and management by telemedicine and the availability of in person appointments. The patient expressed understanding and agreed to proceed.   I discussed the assessment and treatment plan with the patient. The patient was provided an opportunity to ask questions and all were answered. The patient agreed with the plan and demonstrated an understanding of the instructions.   The patient was advised to call back or seek an in-person evaluation if the symptoms worsen or if the condition fails to improve as anticipated. Integrated Behavioral Health Follow Up In-Person Visit  MRN: [id number] Name: Danielle Dawson  Number of Integrated Behavioral Health Clinician visits: Additional Visit  Session Start time: 1500   Session End time: 1530  Total time in minutes: 30   Encounter Diagnoses  Name Primary?   MDD (major depressive disorder), recurrent episode, moderate (HCC) Yes   Generalized anxiety disorder       Types of Service: Video visit and Collaborative care  Subjective: Danielle Dawson is a 43 y.o. female accompanied by self--adult.   Patient was referred by Bruna Creighton NP for establishment of [facility] Collaborative Care.  Patient reports the following symptoms/concerns: Patient reports that she is continuing to experience anxiety, irritability, pressured speech, insomnia, and trouble with focus and concentration.  Patient reports that she picked up the Seroquel  but has not been taking it consistently.  Encouraged patient to take medication as prescribed, and we will reevaluate symptoms at next session.   Duration of problem: ongoing; Severity  of problem: moderate  Objective: Mood: Anxious and Depressed and Affect: Appropriate Risk of harm to self or others: No plan to harm self or others  Life Context: Family and Social: Pt reports that she is continuing to have stress associated with her primary relationship.  Discussed situations involving family related stress, ongoing worries related to family members, and patient taking all of the responsibilities of other people.  Encouraged patient to recognize areas where she is in control and areas where she has little to no control (including the thoughts and behaviors of others).  Patient feels like she is enmeshed with family members in a negative way.  Allowed patient to explore ways that she could set healthier boundaries for herself.       School/Work: Pt reports no immediate concerns re: work at this point in time. Pt reports financially I am okay right now  Self-Care: Pt reports that she has spent quality time w/ family members over Thanksgiving holiday. Pt reports that she is making plans with friends to attend some community events.   Life Changes: Patient reports that her sister found a lump in her breast, which is something that has happened since last session. Pt reports that she is continuing to examine her relationship w/ partner, and identify communication breakdowns/try to improve communication. Pt reports that she's questioning her own communication style, as she is picking up on similar patterns with family members and with her partner.   Patient and/or Family's Strengths/Protective Factors: Social connections, Social and Emotional competence, Concrete supports in place (healthy food, safe environments, etc.), Sense of purpose, and Physical Health (exercise, healthy diet, medication compliance, etc.)  Goals Addressed: Patient will:  Reduce symptoms of: mood instability and stress   Increase knowledge and/or ability of: coping skills, healthy habits, self-management  skills, and stress reduction   Demonstrate ability to: Increase healthy adjustment to current life circumstances  Progress towards Goals: Ongoing  Interventions: Interventions utilized:  Behavioral Activation, Supportive Counseling, and Communication Skills Standardized Assessments completed: Not Needed   Patient and/or Family Response: Pt reports that she has tried making behavioral changes, and is ready to move forward with getting back on Seroquel .  Informed patient that we would review her case at the next collaborative care meeting.  Patient Centered Plan: Patient is on the following Treatment Plan(s):  ADHD referral for testing, client requests a neurology consult for ADHD and Autism testing. (Pt has scheduled appt for ADHD testing) 2. Due to severe side effects from previous psychiatric medications, refer to a psychiatrist or psych specialist for medication management.(Pt does not want psych referral at time of session) 3. Recommend therapy in outpatient setting 4. BH specialist to follow up.  Clinical Assessment/Diagnosis     06/16/2024    3:05 PM 05/28/2024    4:11 PM 04/30/2024    4:15 PM  Depression screen PHQ 2/9  Decreased Interest 0 2 2  Down, Depressed, Hopeless 1 2 2   PHQ - 2 Score 1 4 4   Altered sleeping 3 2 2   Tired, decreased energy 3 2 2   Change in appetite 3 3 2   Feeling bad or failure about yourself  2 1 3   Trouble concentrating 1 2 3   Moving slowly or fidgety/restless 1 1 1   Suicidal thoughts 0 0 0  PHQ-9 Score 14 15 17    Difficult doing work/chores Somewhat difficult Somewhat difficult Somewhat difficult     Data saved with a previous flowsheet row definition       05/28/2024    4:15 PM 04/30/2024    4:15 PM 04/15/2024   12:44 PM  GAD 7 : Generalized Anxiety Score  Nervous, Anxious, on Edge 3 3 2   Control/stop worrying 3 3 3   Worry too much - different things 3 3 3   Trouble relaxing 3 3 1   Restless 3 2 1   Easily annoyed or irritable 2 2 3    Afraid - awful might happen 2 2 1   Total GAD 7 Score 19 18 14   Anxiety Difficulty Very difficult Somewhat difficult Somewhat difficult     Encounter Diagnoses  Name Primary?   MDD (major depressive disorder), recurrent episode, moderate (HCC) Yes   Generalized anxiety disorder     Assessment: Patient requested to go back on Seroquel --IBH Collaborative care psychiatric provider recommended Seroquel  25 mg qs.  Patient reports that she is taking the Seroquel , but not daily.  Encouraged patient to continue taking the medication consistently, and that we will reassess at next appointment symptoms and treatment.  Reviewed importance of self-care, positive social interaction, and setting healthy boundaries with others.  Discussed positive communication strategies with partner.  Patient may benefit from Continued IBH supports with referrals PRN.  Plan: Follow up with behavioral health clinician on : 06/30/2024 Behavioral recommendations: Continue behavioral activation, be aware of self and behaviors, engage in self care behaviors and positive social interaction. Continue compliance with prescribed medication  Danielle Erck R Tuvia Woodrick, LCSW

## 2024-06-17 NOTE — Patient Instructions (Signed)
 Using Behavioral Activation to manage stress/depression symptoms     Identify/understand your own mood triggers.   Structure your day--get up around the same time, eat meals/snacks around the same time, go to bed around the same time.   Purposefully schedule self care time and time to complete tasks. This can include quiet time.  Stimulate your brain--go for a walk, text/call a friend or family member, if you are indoors--go outside (and vice versa), go for a drive, go to a store with bright colors and bright lights. Try to do things in a different way--drive to your favorite places using an alternative route, or instead of starting on the right side of the grocery store when shopping, start on the left side. You might feel a bit uncomfortable doing things outside of the comfort zone, but this is helping the brain create new neural pathways and is very healthy for brain/emotional health.   Physical movement based on your ability. If you can go for a walk, do stretches, even waving your hands to music can trigger feel-good endorphins in the brain and help release physical tension we all hold in our bodies.  Even 5 minutes can make a difference.   Be intentional about doing things that bring you joy (or used to bring you joy), and look for the things in every day that make you happy.  Seek those glimmers of joy each day.  Set a timer for 5 minutes for a harder task (ex. Laundry, washing dishes).  Allow yourself to work distraction-free for 5 minutes, then stop when the timer goes off. If you need a break, take a break. If you want to continue working then set another timer for whatever time you choose.   Limit or eliminate substance use including alcohol, marijuana, or recreational use of prescription medication.  Let in the light!! Open the window blinds, curtains and let natural light in. Even sitting near a window or sitting outside can boost your mood, especially in the wintertime when  there is less daylight.   If you take medication to manage symptoms, remember to take all medications as they are prescribed (please read all labels!!)    Things to envision for ourselves to to improve inspiration, motivation, and initiative :  improving physical wellness, focus on family relationships, focusing on our own mental/emotional well being, being a part of a bigger community, finding a hobby, being a part of something that fosters personal growth, engaging socially with others (even digitally!!!)   ____________________  ?? Self-Care Worksheet  Always prioritize medication compliance--if you are prescribed medications, please take medication as prescribed.   ?? Mental & Emotional Self-Care Practice mindfulness or meditation for 5-10 minutes daily Journal your thoughts or feelings Set boundaries to protect your emotional energy Talk to a therapist or counselor Engage in positive self-talk  ?? Physical Self-Care Get 7-9 hours of sleep each night Move your body (walk, stretch, dance, exercise) Eat nourishing meals and stay hydrated Take breaks to rest and recharge Schedule regular health check-ups  ?? Creative Self-Care Try a new hobby or craft Listen to or play music Draw, paint, or color Write poetry or stories [place] or bake something new  ?? Social Self-Care Spend time with supportive friends or family Join a group or community with shared interests Say "no" when you need to Reach out to someone you trust Plan a fun outing or virtual hangout  ?? Spiritual Self-Care Reflect on your values and beliefs Spend time in nature Read  spiritual or inspirational texts Practice gratitude daily Attend a place of worship or spiritual gathering  ?? Environmental Self-Care Adult Nurse a calming corner or sanctuary Use scents, lighting, or music to set a mood Organize your schedule or workspace Reduce digital  clutter  ____________________________  Emergency Resources:  National Suicide & Crisis Lifeline: Call or text 988  Crisis Text Line: Text HOME to 512-745-9850  [facility]  [address], [place], [place] [address] [phone] or [phone] [facility] 24/7 FOR ANYONE [address] [place], [place]  [phone] Fax: [phone] [email or web address] *Interpreters available *Accepts all insurance and uninsured for Urgent Care needs *Accepts Medicaid and uninsured for outpatient treatment (below)

## 2024-06-29 ENCOUNTER — Encounter: Payer: Self-pay | Admitting: Family Medicine

## 2024-06-30 ENCOUNTER — Ambulatory Visit: Admitting: Licensed Clinical Social Worker

## 2024-06-30 NOTE — Patient Instructions (Signed)
 Using Behavioral Activation to manage stress/depression symptoms     Identify/understand your own mood triggers.   Structure your day--get up around the same time, eat meals/snacks around the same time, go to bed around the same time.   Purposefully schedule self care time and time to complete tasks. This can include quiet time.  Stimulate your brain--go for a walk, text/call a friend or family member, if you are indoors--go outside (and vice versa), go for a drive, go to a store with bright colors and bright lights. Try to do things in a different way--drive to your favorite places using an alternative route, or instead of starting on the right side of the grocery store when shopping, start on the left side. You might feel a bit uncomfortable doing things outside of the comfort zone, but this is helping the brain create new neural pathways and is very healthy for brain/emotional health.   Physical movement based on your ability. If you can go for a walk, do stretches, even waving your hands to music can trigger feel-good endorphins in the brain and help release physical tension we all hold in our bodies.  Even 5 minutes can make a difference.   Be intentional about doing things that bring you joy (or used to bring you joy), and look for the things in every day that make you happy.  Seek those glimmers of joy each day.  Set a timer for 5 minutes for a harder task (ex. Laundry, washing dishes).  Allow yourself to work distraction-free for 5 minutes, then stop when the timer goes off. If you need a break, take a break. If you want to continue working then set another timer for whatever time you choose.   Limit or eliminate substance use including alcohol, marijuana, or recreational use of prescription medication.  Let in the light!! Open the window blinds, curtains and let natural light in. Even sitting near a window or sitting outside can boost your mood, especially in the wintertime when  there is less daylight.   If you take medication to manage symptoms, remember to take all medications as they are prescribed (please read all labels!!)    Things to envision for ourselves to to improve inspiration, motivation, and initiative :  improving physical wellness, focus on family relationships, focusing on our own mental/emotional well being, being a part of a bigger community, finding a hobby, being a part of something that fosters personal growth, engaging socially with others (even digitally!!!)    ANXIETY/PANIC EPISODE MANAGEMENT (CBT/MINDFULNESS BASED)   If you are in a highly stimulating or triggering environment, change your location to a less stimulating or safer environment .   Stimulate your senses by tasting/eating a sour candy (such as a lemon drop) or a strong flavored cough drop.  This triggers smell, taste, and touch since we  have had lots of nerve endings inside of your mouth.   Practice slow, controlled breathing to avoid hyperventilation.  Breathing into your nose for the count of 4 inside your head, holding your breath for the count of 4 inside your head, and exhale slowly counting to 8 inside your head.  This is called 4-4-8 breathing, or triangle breathing. (Controlled breathing). This helps manage panic, anxiety, anger, and tearfulness.  Additional grounding exercises include rubbing your hands softly together, or wiggling toes inside your shoes, pretending to grasp the floor with your toes.    Listen to calming music or sounds  Count backwards in your head by  twos or tens or recite multiplication tables in your head.  Visualize a calming, happy place and identify 5 explicit details about this place (color, temperature, smells, visual details, etc.)  Splash cold water on your face or hold an ice cube in your hand (alternate hands)  Clench and release muscle groups (hands, shoulders, facial muscles)  Engage in soothing activities to recover after a  stressful/anxious episode such as: drinking a warm beverage, sitting in your favorite comfortable location, positive physical contact with a pet or weighted blanket, using positive self talk/positive affirmations.   Download PTSD Coach app for your phone or tablet--its free and has lots of tips to help you manage panic episodes no matter where you are!    Emergency Resources:  National Suicide & Crisis Lifeline: Call or text 988  Crisis Text Line: Text HOME to 332-080-6994  [facility]  [address] [place], [place] [address] [phone] or [phone] [facility] 24/7 FOR ANYONE [address], [place], [place]  [phone] Fax: [phone] [email or web address] *Interpreters available *Accepts all insurance and uninsured for Urgent Care needs *Accepts Medicaid and uninsured for outpatient treatment (below)

## 2024-06-30 NOTE — BH Specialist Note (Addendum)
 " Virtual Visit via Video Note  I connected with Danielle Dawson on 06/30/2024 at  3:00 PM EST by a video enabled telemedicine application and verified that I am speaking with the correct person using two identifiers.  Location: Patient: Primary residence, [place] Provider: Clinician virtual office, [place]   I discussed the limitations of evaluation and management by telemedicine and the availability of in person appointments. The patient expressed understanding and agreed to proceed.   I discussed the assessment and treatment plan with the patient. The patient was provided an opportunity to ask questions and all were answered. The patient agreed with the plan and demonstrated an understanding of the instructions.     MRN: [id number] Name: Danielle Dawson  Number of Integrated Behavioral Health Clinician visits: Additional Visit  Session Start time: 1500   Session End time: 1540  Total time in minutes: 40   Encounter Diagnosis  Name Primary?   MDD (major depressive disorder), recurrent episode, moderate (HCC) Yes    Types of Service: Video visit and Collaborative care  Subjective: Danielle Dawson is a 43 y.o. female accompanied by self--adult.   Patient was referred by Bruna Creighton NP for establishment of [facility] Collaborative Care.  Patient reports the following symptoms/concerns: Patient reports that she is continuing to experience anxiety, irritability, pressured speech, insomnia, and trouble with focus and concentration.  Patient reports that she picked up the Seroquel  but has not been taking it consistently.  Encouraged patient to take medication as prescribed, and we will reevaluate symptoms at next session.   Duration of problem: ongoing; Severity of problem: moderate  Objective: Mood: Anxious and Depressed and Affect: Appropriate Risk of harm to self or others: No plan to harm self or others  Life Context: Family and Social: Pt reports that she is feeling more emotions  recently--feels it is because of the Seroquel . Allowed pt to explore scenarios that have been triggering hyperemotionality. Reviewed ways that pt is managing her emotions in the moment.   Discussed recent reconnection w/ ex stepfather--feels he is a positive female role model in her life. He was best friends with her bio father.  Discussed thoughts/feelings associated with a friend that has made decision to move to [place] .        School/Work: Pt reports no immediate concerns re: work at this point in time. Pt reports financially I am okay right now. Pt reports she is working a lot of overtime.   Self-Care: Pt reports she will be working a lot but will intentionally focus on herself throughout the holidays.  Life Changes: Pt reports that she attended concert recently with her partner. Allowed pt to explore the situation and emotions that were triggered. Discussed recent communication w/ partner and how they are both problem-solving.  Patient and/or Family's Strengths/Protective Factors: Social connections, Social and Patent attorney, Concrete supports in place (healthy food, safe environments, etc.), Sense of purpose, and Physical Health (exercise, healthy diet, medication compliance, etc.)  Goals Addressed: Patient will:  Reduce symptoms of: mood instability and stress   Increase knowledge and/or ability of: coping skills, healthy habits, self-management skills, and stress reduction   Demonstrate ability to: Increase healthy adjustment to current life circumstances  Progress towards Goals: Ongoing  Interventions: Interventions utilized:  Behavioral Activation, Supportive Counseling, and Communication Skills Standardized Assessments completed: Not Needed   Patient and/or Family Response: Pt compliant w/ Seroquel . Reports that she's having some daytime drowsiness that she is associating with the medication.   Patient Centered Plan:  Patient is on the following Treatment Plan(s):   ADHD referral for testing, client requests a neurology consult for ADHD and Autism testing. (Pt has scheduled appt for ADHD testing) 2. Due to severe side effects from previous psychiatric medications, refer to a psychiatrist or psych specialist for medication management.(Pt does not want psych referral at time of session) 3. Recommend therapy in outpatient setting 4. BH specialist to follow up.  Clinical Assessment/Diagnosis     06/16/2024    3:05 PM 05/28/2024    4:11 PM 04/30/2024    4:15 PM  Depression screen PHQ 2/9  Decreased Interest 0 2 2  Down, Depressed, Hopeless 1 2 2   PHQ - 2 Score 1 4 4   Altered sleeping 3 2 2   Tired, decreased energy 3 2 2   Change in appetite 3 3 2   Feeling bad or failure about yourself  2 1 3   Trouble concentrating 1 2 3   Moving slowly or fidgety/restless 1 1 1   Suicidal thoughts 0 0 0  PHQ-9 Score 14 15 17    Difficult doing work/chores Somewhat difficult Somewhat difficult Somewhat difficult     Data saved with a previous flowsheet row definition       05/28/2024    4:15 PM 04/30/2024    4:15 PM 04/15/2024   12:44 PM  GAD 7 : Generalized Anxiety Score  Nervous, Anxious, on Edge 3 3 2   Control/stop worrying 3 3 3   Worry too much - different things 3 3 3   Trouble relaxing 3 3 1   Restless 3 2 1   Easily annoyed or irritable 2 2 3   Afraid - awful might happen 2 2 1   Total GAD 7 Score 19 18 14   Anxiety Difficulty Very difficult Somewhat difficult Somewhat difficult     Encounter Diagnosis  Name Primary?   MDD (major depressive disorder), recurrent episode, moderate (HCC) Yes     Assessment: Pt reports that she is experiencing fewer symptoms of depression and anxiety. Pt reports that she is feeling as though she can experience emotions on a higher level since going on Seroquel . Pt reports that she's noticed some crying--feels that its therapeutic. SABRA  Patient may benefit from Continued IBH supports with referrals PRN.  Plan: Follow  up with behavioral health clinician on : 07/14/24 Behavioral recommendations: Continue behavioral activation, be aware of self and behaviors, engage in self care behaviors and positive social interaction. Continue compliance with prescribed medication  Lylee Corrow R Curley Hogen, LCSW   "

## 2024-07-14 ENCOUNTER — Ambulatory Visit (INDEPENDENT_AMBULATORY_CARE_PROVIDER_SITE_OTHER): Payer: Self-pay | Admitting: Licensed Clinical Social Worker

## 2024-07-14 DIAGNOSIS — Z91199 Patient's noncompliance with other medical treatment and regimen due to unspecified reason: Secondary | ICD-10-CM

## 2024-07-14 NOTE — BH Specialist Note (Signed)
 [facility] Collaborative Care Clinician attempted to connect with Danielle Dawson for an appointment scheduled 07/14/2024 at 11:30 AM EST via [facility] (EPIC/MyChart).  Pt did not connect in virtual room--after 15 minutes, clinician closed out [facility] (EPIC/MyChart) virtual room.   Clinician attempted to connect w/ patient via phone--clinician left HIPAA compliant voice mail to call office to reschedule appt.    Clinician spent total of 15 minutes attempting to connect with patient for scheduled visit.

## 2024-07-24 ENCOUNTER — Other Ambulatory Visit: Payer: Self-pay | Admitting: Family Medicine

## 2024-07-24 MED ORDER — LIRAGLUTIDE -WEIGHT MANAGEMENT 18 MG/3ML ~~LOC~~ SOPN: PEN_INJECTOR | SUBCUTANEOUS | 0 refills | Status: AC

## 2024-08-21 ENCOUNTER — Encounter: Payer: Self-pay | Admitting: Family Medicine

## 2024-10-14 ENCOUNTER — Ambulatory Visit: Payer: Self-pay | Admitting: Family Medicine

## 2025-04-21 ENCOUNTER — Encounter: Payer: Self-pay | Admitting: Family Medicine
